# Patient Record
Sex: Female | Born: 1970
Health system: Southern US, Community
[De-identification: ages and names within clinical notes are randomized; demographics above are authoritative.]

## PROBLEM LIST (undated history)

## (undated) DIAGNOSIS — I10 Essential (primary) hypertension: Secondary | ICD-10-CM

## (undated) DIAGNOSIS — F411 Generalized anxiety disorder: Secondary | ICD-10-CM

## (undated) HISTORY — DX: Essential (primary) hypertension: I10

## (undated) HISTORY — DX: Generalized anxiety disorder: F41.1

## (undated) HISTORY — PX: TONSILLECTOMY: SUR1361

---

## 2000-11-30 ENCOUNTER — Encounter: Payer: Self-pay | Admitting: Otolaryngology

## 2000-11-30 ENCOUNTER — Ambulatory Visit (HOSPITAL_COMMUNITY): Admission: RE | Admit: 2000-11-30 | Discharge: 2000-11-30 | Payer: Self-pay | Admitting: Family Medicine

## 2001-08-09 ENCOUNTER — Other Ambulatory Visit: Admission: RE | Admit: 2001-08-09 | Discharge: 2001-08-09 | Payer: Self-pay | Admitting: Obstetrics and Gynecology

## 2001-12-12 ENCOUNTER — Ambulatory Visit (HOSPITAL_COMMUNITY): Admission: RE | Admit: 2001-12-12 | Discharge: 2001-12-12 | Payer: Self-pay | Admitting: Obstetrics and Gynecology

## 2002-02-12 ENCOUNTER — Inpatient Hospital Stay (HOSPITAL_COMMUNITY): Admission: AD | Admit: 2002-02-12 | Discharge: 2002-02-12 | Payer: Self-pay | Admitting: Obstetrics and Gynecology

## 2002-02-19 ENCOUNTER — Inpatient Hospital Stay (HOSPITAL_COMMUNITY): Admission: AD | Admit: 2002-02-19 | Discharge: 2002-02-23 | Payer: Self-pay | Admitting: Obstetrics and Gynecology

## 2002-02-20 ENCOUNTER — Encounter (INDEPENDENT_AMBULATORY_CARE_PROVIDER_SITE_OTHER): Payer: Self-pay

## 2002-02-24 ENCOUNTER — Encounter: Admission: RE | Admit: 2002-02-24 | Discharge: 2002-03-26 | Payer: Self-pay | Admitting: Obstetrics and Gynecology

## 2002-03-27 ENCOUNTER — Encounter: Admission: RE | Admit: 2002-03-27 | Discharge: 2002-04-26 | Payer: Self-pay | Admitting: Obstetrics and Gynecology

## 2002-04-18 ENCOUNTER — Other Ambulatory Visit: Admission: RE | Admit: 2002-04-18 | Discharge: 2002-04-18 | Payer: Self-pay | Admitting: Obstetrics and Gynecology

## 2003-05-29 ENCOUNTER — Other Ambulatory Visit: Admission: RE | Admit: 2003-05-29 | Discharge: 2003-05-29 | Payer: Self-pay | Admitting: Obstetrics and Gynecology

## 2003-12-02 ENCOUNTER — Ambulatory Visit (HOSPITAL_COMMUNITY): Admission: RE | Admit: 2003-12-02 | Discharge: 2003-12-02 | Payer: Self-pay | Admitting: Family Medicine

## 2003-12-09 ENCOUNTER — Ambulatory Visit (HOSPITAL_COMMUNITY): Admission: RE | Admit: 2003-12-09 | Discharge: 2003-12-09 | Payer: Self-pay | Admitting: Family Medicine

## 2005-03-01 ENCOUNTER — Other Ambulatory Visit: Admission: RE | Admit: 2005-03-01 | Discharge: 2005-03-01 | Payer: Self-pay | Admitting: Obstetrics and Gynecology

## 2005-06-30 ENCOUNTER — Ambulatory Visit (HOSPITAL_COMMUNITY): Admission: RE | Admit: 2005-06-30 | Discharge: 2005-06-30 | Payer: Self-pay | Admitting: Family Medicine

## 2006-05-01 ENCOUNTER — Emergency Department (HOSPITAL_COMMUNITY): Admission: EM | Admit: 2006-05-01 | Discharge: 2006-05-01 | Payer: Self-pay | Admitting: Emergency Medicine

## 2007-06-13 ENCOUNTER — Encounter: Admission: RE | Admit: 2007-06-13 | Discharge: 2007-06-13 | Payer: Self-pay | Admitting: Obstetrics and Gynecology

## 2010-05-24 ENCOUNTER — Encounter: Payer: Self-pay | Admitting: Obstetrics and Gynecology

## 2010-06-12 ENCOUNTER — Other Ambulatory Visit: Payer: Self-pay | Admitting: Obstetrics and Gynecology

## 2010-06-12 DIAGNOSIS — N644 Mastodynia: Secondary | ICD-10-CM

## 2010-06-18 ENCOUNTER — Ambulatory Visit
Admission: RE | Admit: 2010-06-18 | Discharge: 2010-06-18 | Disposition: A | Discharge status: Home / Self Care | Payer: Commercial Indemnity | Source: Ambulatory Visit | Attending: Obstetrics and Gynecology | Admitting: Obstetrics and Gynecology

## 2010-06-18 DIAGNOSIS — N644 Mastodynia: Secondary | ICD-10-CM

## 2010-09-18 NOTE — H&P (Signed)
NAME:  Robin Ball, Robin Ball                        ACCOUNT NO.:  [id number]   MEDICAL RECORD NO.:  [id number]                   PATIENT TYPE:  INP   LOCATION:  9164                                 FACILITY:  [facility]   PHYSICIAN:  Juluis Mire, M.D.                DATE OF BIRTH:  1970-12-19   DATE OF ADMISSION:  02/19/2002  DATE OF DISCHARGE:                                HISTORY & PHYSICAL   HISTORY OF PRESENT ILLNESS:  The patient is a 40 year old primigravida  married female who presents for Cytotec ripening of the cervix and  induction.  Her last menstrual period was January 28 giving her an estimated  date of confinement of November 4 and an estimated gestational age of [redacted]  weeks.  This is consistent with initial examination and prior ultrasound.   In relation to the present admission, the patient has had some mildly  elevated mean arterial pressures.  She has been running systolics in the  130s and diastolics between 80 and 90.  Because of this she is brought in  for Cytotec ripening of the cervix and induction.  She has had some problems  with slight headaches.  She denies any right upper quadrant pain or  scotomata.  Her prenatal course has otherwise been uncomplicated.  She has  had a slightly elevated 50 g Glucola but her three hour glucose tolerance  test was within normal limits.  Her group B Strep was also positive.   ALLERGIES:  No known drug allergies.   MEDICATIONS:  Prenatal vitamins.   PAST MEDICAL HISTORY:  Please see prenatal records.   SOCIAL HISTORY:  Please see prenatal records.   FAMILY HISTORY:  Please see prenatal records.   REVIEW OF SYSTEMS:  Noncontributory.   PHYSICAL EXAMINATION:  VITAL SIGNS:  The patient's blood pressure is 126/84.  The vital signs are stable.  HEENT:  The patient is normocephalic.  Pupils are equal, round, and reactive  to light and accommodation.  Extraocular movements are intact.  Sclerae and  conjunctivae are clear.   Oropharynx clear.  NECK:  Without thyromegaly.  BREASTS:  Not examined.  LUNGS:  Clear.  CARDIOVASCULAR:  Regular rate with a grade 2/6 systolic ejection murmur.  No  clicks or gallops.  ABDOMEN:  Gravid uterus consistent with dates.  PELVIC:  1-2 cm, 50% effaced, vertex presentation, -1 station.  The  membranes intact.  EXTREMITIES:  Trace edema.  Deep tendon reflexes 2+ with no clonus.   IMPRESSION:  1. Intrauterine pregnancy at 38 weeks with mild elevation of the mean     arterial pressure.  2. Positive group B Strep.    PLAN:  The patient will undergo Cytotec ripening of the cervix with  induction.  Group B Strep antibiotics will be started and routine  obstetrical management will be undertaken.  Juluis Mire, M.D.    JSM/MEDQ  D:  02/20/2002  T:  02/20/2002  Job:  [id number]

## 2010-09-18 NOTE — Op Note (Signed)
NAME:  Robin Ball, Robin Ball                        ACCOUNT NO.:  [id number]   MEDICAL RECORD NO.:  [id number]                   PATIENT TYPE:  INP   LOCATION:  9145                                 FACILITY:  [facility]   PHYSICIAN:  Guy Sandifer. Arleta Creek, M.D.           DATE OF BIRTH:  06/17/1970   DATE OF PROCEDURE:  02/20/2002  DATE OF DISCHARGE:                                 OPERATIVE REPORT   PREOPERATIVE DIAGNOSIS:  Arrest of descent.   POSTOPERATIVE DIAGNOSIS:  Arrest of descent.   PROCEDURE:  Low transverse cesarean section.   SURGEON:  Guy Sandifer. Henderson Cloud, M.D.   ANESTHESIA:  Epidural, Burnett Corrente, M.D.   ESTIMATED BLOOD LOSS:  1000 cc.   SPECIMENS:  Placenta, sent to pathology.   FINDINGS:  Viable female infant, Apgars of 8 and 9 at one and five minutes,  respectively.  Birth weight 8 pounds 6 ounces.  Arterial cord pH 7.31.   INDICATIONS AND CONSENT:  The patient is a 40 year old married white female,  G1, P0, who was admitted on the evening of 02/19/02 for a two-stage  induction secondary to mild pregnancy-induced hypertension.  On the morning  of 02/20/02, she was 4 cm dilated, 50% effaced, and artificial rupture of  membranes with clear fluid was carried out.  Pitocin augmentation was  subsequently started.  She progressed to complete and pushing but had a  posterior presentation.  She did not make it below 0 station after one hour  of pushing.  Diagnosis of arrest of descent was made.  Cesarean section was  discussed with the patient and husband.  Potential risks and complications  were discussed preoperatively, including but not limited to infection, organ  damage, bleeding requiring transfusion of blood products with possible  transfusion, HIV, and hepatitis acquisition.  All questions were answered.  A consent is signed on the chart.   DESCRIPTION OF PROCEDURE:  The patient is taken to the operating room, where  epidural anesthetic is augmented to a surgical level.   Foley catheter is  already in place.  She is prepped and draped in a sterile fashion.  After  testing for adequate anesthesia, skin is entered through a Pfannenstiel  incision and dissection is carried out in layers to the peritoneum.  The  peritoneum is incised, extended superiorly and inferiorly.  The  vesicouterine peritoneum is taken down cephalolaterally.  The bladder flap  is developed, and the bladder blade is placed.  The uterus is incised in a  low transverse fashion, and the uterine cavity is entered bluntly with a  hemostat.  The uterine incision is extended cephalolaterally with the  fingers, and the vertex is delivered without difficulty.  The oropharynx and  nasopharynx are suctioned.  The remainder of the baby is delivered.  Good  cry and tone is noted.  Cord is clamped and cut.  The placenta is delivered  and sent to pathology.  The uterine cavity is cleaned.  The uterus is closed  in a running locking layer of 0 Monocryl, followed by a running imbricating  layer of 0 Monocryl, which achieves good hemostasis.  Tubes and ovaries are  normal bilaterally.  The left angle of the incision extended some at the  anterior leaf of the broad ligament.  This was closed with the imbricating  layer.  Careful palpation revealed this did not extend through the posterior  leaf of the broad or did not involve any bowel or other structures  posteriorly.  Irrigation is carried out.  Anterior peritoneum is closed in  running fashion with 0 Monocryl, which is also used to reapproximate the  pyramidalis muscle in the midline.  Anterior rectus fascia is closed in a  running fashion with 0 PDS suture.  The patient has some discomfort at this  point, and the incision is irrigated with approximately 10-15 cc of 0.5%  plain Marcaine.  The skin is closed with clips.  All sponge, instrument, and  needle counts are correct, and the patient is transferred to the recovery  room in stable  condition.                                               Guy Sandifer Arleta Creek, M.D.    JET/MEDQ  D:  02/20/2002  T:  02/21/2002  Job:  [id number]

## 2010-09-18 NOTE — Discharge Summary (Signed)
NAME:  Robin Ball, Robin Ball                        ACCOUNT NO.:  [id number]   MEDICAL RECORD NO.:  [id number]                   PATIENT TYPE:  INP   LOCATION:  9145                                 FACILITY:  [facility]   PHYSICIAN:  Duke Salvia. Marcelle Overlie, M.D.            DATE OF BIRTH:  09-24-1970   DATE OF ADMISSION:  02/19/2002  DATE OF DISCHARGE:  02/23/2002                                 DISCHARGE SUMMARY   ADMISSION DIAGNOSES:  1. Intrauterine pregnancy at 38 weeks, estimated gestational age.  2. Mild pregnancy induced hypertension.  3. Induction of labor.  4. Arrest of descent.   DISCHARGE DIAGNOSES:  1. Status post low transverse cesarean section.  2. Viable female infant.   PROCEDURE:  Primary low transverse cesarean section.   REASON FOR ADMISSION:  Please seen dictated H&P.   HOSPITAL COURSE:  The patient was a 40 year old gravida 1, para 0 married  white female at [redacted] weeks gestational age who was admitted to [facility] for an induction of labor.  The patient had had mild  elevation in main arterial pressures.  She had noted a slight headache.  She  denied right upper quadrant pain or scotomata.  Deep tendon reflexes are 2+  without clonus.  The patient was admitted for Cytotec for ripening of the  cervix.  Cervix was noted to be 1 to 2 cm dilated at 50% effaced.  Vertex at  a -1 station.  Membranes were intact.  The patient was also noted to be  positive for group B Beta Strep.  IV antibiotics were administered.  On the  following morning, cervix was noted to be 4 cm dilated, 50% effaced.  Artificial rupture of membranes was performed which revealed clear fluid.  Pitocin augmentation was started.  The patient did progress to complete  dilation.  Vertex was noted in the OP position.  After pushing x1 hour  without progress below the 0 station, decision was made to proceed with a  cesarean delivery.  The patient was taken to the operating room where  epidural  anesthesia was dosed adequately to a surgical level.  A low  transverse incision was made with the delivery of a viable female infant who  weight 8 pounds 6 ounces with Apgars of 8 at one minute and 9 at five  minutes.  Umbilical artery pH was 7.31.  The patient tolerated the procedure  well and was taken to the recovery room in stable condition.   On postoperative day #1, the patient had good return of bowel function.  Abdominal dressing was noted to be clean, dry and intact.  Labs revealed  hemoglobin of 8.7, platelet count 268,000 and wbc count of 13.8.   On postoperative day #2, abdominal dressing was partially removed which  revealed incision that was clean, dry and intact.  The patient was  ambulating without difficulty.  She was tolerating a regular diet without  complaints of nausea and vomiting.   On postoperative day #3, incision was clean, dry, and intact.  Staples were  removed and the patient was discharged home.   CONDITION ON DISCHARGE:  Good.   DIET:  Regular.   ACTIVITY:  As tolerated.  No heavy lifting.  No driving x2 weeks.  No  vaginal entry.   FOLLOW UP:  The patient is to follow up in the office in one to two weeks.   DISCHARGE INSTRUCTIONS:  She is to call for temperature greater than 100  degrees, persistent nausea and vomiting, heavy vaginal bleeding, and/or  redness or drainage from the incision site.   DISCHARGE MEDICATIONS:  1. Percocet 5/325 #30 one p.o. every four to six hours p.r.n. pain.  2. Motrin 600 mg every six hours p.r.n.  3. Prenatal vitamins one p.o. daily.  4. Niferex 150 mg one p.o. daily.  5. ProctoFoam HC, the patient to apply to affected area b.i.d.  6. Colace one p.o. daily p.r.n.     Julio Sicks, N.P.                        Richard M. Marcelle Overlie, M.D.    CC/MEDQ  D:  03/26/2002  T:  03/26/2002  Job:  [id number]

## 2011-08-16 ENCOUNTER — Other Ambulatory Visit: Payer: Self-pay | Admitting: Family Medicine

## 2011-08-16 ENCOUNTER — Ambulatory Visit (HOSPITAL_COMMUNITY)
Admission: RE | Admit: 2011-08-16 | Discharge: 2011-08-16 | Disposition: A | Discharge status: Home / Self Care | Payer: Commercial Indemnity | Source: Ambulatory Visit | Attending: Family Medicine | Admitting: Family Medicine

## 2011-08-16 DIAGNOSIS — M25569 Pain in unspecified knee: Secondary | ICD-10-CM | POA: Diagnosis not present

## 2011-08-16 DIAGNOSIS — M7730 Calcaneal spur, unspecified foot: Secondary | ICD-10-CM | POA: Insufficient documentation

## 2011-08-16 DIAGNOSIS — M25562 Pain in left knee: Secondary | ICD-10-CM

## 2011-08-16 DIAGNOSIS — M25579 Pain in unspecified ankle and joints of unspecified foot: Secondary | ICD-10-CM | POA: Insufficient documentation

## 2012-05-15 ENCOUNTER — Ambulatory Visit (HOSPITAL_COMMUNITY)
Admission: RE | Admit: 2012-05-15 | Discharge: 2012-05-15 | Disposition: A | Discharge status: Home / Self Care | Payer: Commercial Indemnity | Source: Ambulatory Visit | Attending: Family Medicine | Admitting: Family Medicine

## 2012-05-15 ENCOUNTER — Other Ambulatory Visit: Payer: Self-pay | Admitting: Family Medicine

## 2012-05-15 DIAGNOSIS — R229 Localized swelling, mass and lump, unspecified: Secondary | ICD-10-CM | POA: Insufficient documentation

## 2012-05-30 ENCOUNTER — Encounter (INDEPENDENT_AMBULATORY_CARE_PROVIDER_SITE_OTHER): Payer: Self-pay | Admitting: General Surgery

## 2012-05-30 ENCOUNTER — Ambulatory Visit (INDEPENDENT_AMBULATORY_CARE_PROVIDER_SITE_OTHER): Payer: Commercial Indemnity | Admitting: General Surgery

## 2012-05-30 VITALS — BP 142/84 | HR 80 | Temp 98.2°F | Resp 16 | Ht 63.0 in | Wt 161.4 lb

## 2012-05-30 DIAGNOSIS — R229 Localized swelling, mass and lump, unspecified: Secondary | ICD-10-CM

## 2012-05-30 DIAGNOSIS — R222 Localized swelling, mass and lump, trunk: Secondary | ICD-10-CM

## 2012-05-30 DIAGNOSIS — K649 Unspecified hemorrhoids: Secondary | ICD-10-CM

## 2012-05-30 MED ORDER — DOCUSATE SODIUM 100 MG PO CAPS: 100.0000 mg | ORAL_CAPSULE | Freq: Two times a day (BID) | ORAL | Status: DC

## 2012-05-30 MED ORDER — HYDROCORTISONE 2.5 % RE CREA: TOPICAL_CREAM | Freq: Two times a day (BID) | RECTAL | Status: DC

## 2012-05-30 NOTE — Patient Instructions (Addendum)
Hemorrhoids  Hemorrhoids are enlarged (dilated) veins around the rectum. There are 2 types of hemorrhoids, and the type of hemorrhoid is determined by its location. Internal hemorrhoids occur in the veins just inside the rectum.They are usually not painful, but they may bleed.However, they may poke through to the outside and become irritated and painful. External hemorrhoids involve the veins outside the anus and can be felt as a painful swelling or hard lump near the anus.They are often itchy and may crack and bleed. Sometimes clots will form in the veins. This makes them swollen and painful. These are called thrombosed hemorrhoids. CAUSES Causes of hemorrhoids include:  Pregnancy. This increases the pressure in the hemorrhoidal veins.   Constipation.   Straining to have a bowel movement.   Obesity.   Heavy lifting or other activity that caused you to strain.   TREATMENT Most of the time hemorrhoids improve in 1 to 2 weeks. However, if symptoms do not seem to be getting better or if you have a lot of rectal bleeding, your caregiver may perform a procedure to help make the hemorrhoids get smaller or remove them completely.Possible treatments include:  Rubber band ligation. A rubber band is placed at the base of the hemorrhoid to cut off the circulation.   Sclerotherapy. A chemical is injected to shrink the hemorrhoid.   Infrared light therapy. Tools are used to burn the hemorrhoid.   Hemorrhoidectomy. This is surgical removal of the hemorrhoid.   HOME CARE INSTRUCTIONS   Increase fiber in your diet. Ask your caregiver about using fiber supplements.   Drink enough water and fluids to keep your urine clear or pale yellow.   Exercise regularly.   Go to the bathroom when you have the urge to have a bowel movement. Do not wait.   Avoid straining to have bowel movements.   Keep the anal area dry and clean.   Only take over-the-counter or prescription medicines for pain,  discomfort, or fever as directed by your caregiver.   Take warm sitz baths for 20 to 30 minutes, 3 to 4 times per day.   If the hemorrhoids are very tender and swollen, place ice packs on the area as tolerated. Using ice packs between sitz baths may be helpful. Fill a plastic bag with ice. Place a towel between the bag of ice and your skin.   Medicated creams and suppositories may be used or applied as directed.   Do not use a donut-shaped pillow or sit on the toilet for long periods. This increases blood pooling and pain.   SEEK MEDICAL CARE IF:   You have increasing pain and swelling that is not controlled with your medicine.   You have uncontrolled bleeding.   You have difficulty or you are unable to have a bowel movement.   You have pain or inflammation outside the area of the hemorrhoids.   You have chills or an oral temperature above 102 F (38.9 C).     

## 2012-05-30 NOTE — Progress Notes (Signed)
Patient ID: Robin Ball, female   DOB: [birth year], 42 y.o.   MRN: [id number]  Chief Complaint  Patient presents with  . New Evaluation    eval cyst on lower back    HPI Robin Ball is a 42 y.o. female.   HPI Patient is a 42 year old female who presents with a nodule on her left back she noticed approximately a month ago. She stated that when she first noticed it was much larger than it is now. It was red for a short amount of time and she describes it as almost like a pimple.  It has gotten less tender and smaller. She says it is difficult to feel when she is lying down. It does get sore after she's been on her feet for a long period of time. She feels a tugging that extends "to the bone."  She also has had issues with hemorrhoids since the birth of her child. She states that is "done with it". She thinks these are mostly skin tacks. She denies bleeding or itching. Her primary difficulty is in cleaning and having to wipe often. She has not taken any over-the-counter or prescription hemorrhoid medications. She has not taken any Colace previously.   History reviewed. No pertinent past medical history.  Past Surgical History  Procedure Date  . Cesarean section     Family History  Problem Relation Age of Onset  . Heart disease Father   . Hypertension Father     Social History History  Substance Use Topics  . Smoking status: Former Games developer  . Smokeless tobacco: Never Used  . Alcohol Use: No    No Known Allergies  Current Outpatient Prescriptions  Medication Sig Dispense Refill  . CRYSELLE-28 0.3-30 MG-MCG tablet       . docusate sodium (COLACE) 100 MG capsule Take 1 capsule (100 mg total) by mouth 2 (two) times daily.  60 capsule  1  . hydrocortisone (ANUSOL-HC) 2.5 % rectal cream Place rectally 2 (two) times daily.  30 g  2    Review of Systems Review of Systems  All other systems reviewed and are negative.    Blood pressure 142/84, pulse 80, temperature 98.2 F  (36.8 C), temperature source Oral, resp. rate 16, height 5\' 3"  (1.6 m), weight 161 lb 6.4 oz (73.211 kg).  Physical Exam Physical Exam  Constitutional: She is oriented to person, place, and time. She appears well-developed and well-nourished. No distress.  HENT:  Head: Normocephalic and atraumatic.  Right Ear: External ear normal.  Left Ear: External ear normal.  Eyes: Conjunctivae normal are normal. Pupils are equal, round, and reactive to light. Right eye exhibits no discharge. Left eye exhibits no discharge. No scleral icterus.  Neck: Normal range of motion. No thyromegaly present.  Cardiovascular: Normal rate, regular rhythm, normal heart sounds and intact distal pulses.   Pulmonary/Chest: Effort normal and breath sounds normal. No respiratory distress. She exhibits no tenderness.  Abdominal: Soft. She exhibits no distension and no mass. There is no tenderness. There is no guarding.  Genitourinary: Rectal exam shows external hemorrhoid (anterior external hemorrhoids.  Posteriorly are flatter skin tags.  ).  Musculoskeletal: Normal range of motion. She exhibits tenderness (over left iliac crest.  small 1 cm thickening.  very difficult to feel while lying prone.  easier to feel while standing.). She exhibits no edema.  Neurological: She is alert and oriented to person, place, and time. Coordination normal.  Skin: Skin is warm and dry. No rash noted.  She is not diaphoretic. No erythema. No pallor.  Psychiatric: She has a normal mood and affect. Her behavior is normal. Judgment and thought content normal.    Data Reviewed Ultrasound shows 8x6x8 mm nodule partially in the dermis of the affected location.    Assessment/Plan    Mass on back Likely small sebaceous cyst, but difficult to feel.    Has already improved significantly.   Will recheck in 8 weeks.   If still bothering her, will schedule removal.    Hemorrhoids Posterior components are skin tags, but anterior are  hemorrhoids.  Will do stool softeners and anusol HC  Follow up in 8 weeks.            Amity Roes 05/30/2012, 12:31 PM

## 2012-05-30 NOTE — Assessment & Plan Note (Signed)
Likely small sebaceous cyst, but difficult to feel.    Has already improved significantly.   Will recheck in 8 weeks.   If still bothering her, will schedule removal.

## 2012-05-30 NOTE — Assessment & Plan Note (Signed)
Posterior components are skin tags, but anterior are hemorrhoids.  Will do stool softeners and anusol HC  Follow up in 8 weeks.

## 2012-12-30 ENCOUNTER — Emergency Department (HOSPITAL_COMMUNITY)
Admission: EM | Admit: 2012-12-30 | Discharge: 2012-12-30 | Disposition: A | Discharge status: Home / Self Care | Payer: Commercial Indemnity | Attending: Emergency Medicine | Admitting: Emergency Medicine

## 2012-12-30 ENCOUNTER — Encounter (HOSPITAL_COMMUNITY): Payer: Self-pay

## 2012-12-30 DIAGNOSIS — Z87891 Personal history of nicotine dependence: Secondary | ICD-10-CM | POA: Insufficient documentation

## 2012-12-30 DIAGNOSIS — R110 Nausea: Secondary | ICD-10-CM | POA: Insufficient documentation

## 2012-12-30 DIAGNOSIS — K5900 Constipation, unspecified: Secondary | ICD-10-CM | POA: Insufficient documentation

## 2012-12-30 DIAGNOSIS — K645 Perianal venous thrombosis: Secondary | ICD-10-CM | POA: Insufficient documentation

## 2012-12-30 MED ORDER — HYDROCODONE-ACETAMINOPHEN 5-325 MG PO TABS
1.0000 | ORAL_TABLET | Freq: Once | ORAL | Status: AC
  Administered 2012-12-30: 1 via ORAL
  Filled 2012-12-30: qty 1

## 2012-12-30 MED ORDER — BELLADONNA ALKALOIDS-OPIUM 16.2-60 MG RE SUPP: 60.0000 mg | Freq: Four times a day (QID) | RECTAL | Status: DC | PRN

## 2012-12-30 MED ORDER — DOCUSATE SODIUM 100 MG PO CAPS: 100.0000 mg | ORAL_CAPSULE | Freq: Two times a day (BID) | ORAL | Status: DC | PRN

## 2012-12-30 NOTE — ED Provider Notes (Signed)
CSN: [id number]     Arrival date & time 12/30/12  1308 History   First MD Initiated Contact with Patient 12/30/12 1314     No chief complaint on file.  (Consider location/radiation/quality/duration/timing/severity/associated sxs/prior Treatment) HPI Comments: Patient with long-standing history of hemorrhoids presents with extreme pain from her external hemorrhoids x24 hours. States the entire region as throbbing and she has occasional sharp pains. States that this pain is worse than she has ever had before with her hemorrhoids. Her records, patient has been seen by [facility] surgery, Dr. Donell Beers, in the past for the same.  Denies fevers, chills, abdominal pain. Patient has had harder stools recently. Denies blood in her stool has been using topical hydrocortisone cream and Aleve without improvement.  The history is provided by the patient.    History reviewed. No pertinent past medical history. Past Surgical History  Procedure Laterality Date  . Cesarean section     Family History  Problem Relation Age of Onset  . Heart disease Father   . Hypertension Father    History  Substance Use Topics  . Smoking status: Former Games developer  . Smokeless tobacco: Never Used  . Alcohol Use: No   OB History   Grav Para Term Preterm Abortions TAB SAB Ect Mult Living                 Review of Systems  Constitutional: Negative for fever and chills.  Gastrointestinal: Positive for nausea, constipation and rectal pain. Negative for vomiting, abdominal pain, blood in stool and anal bleeding.    Allergies  Review of patient's allergies indicates no known allergies.  Home Medications   Current Outpatient Rx  Name  Route  Sig  Dispense  Refill  . CRYSELLE-28 0.3-30 MG-MCG tablet               . docusate sodium (COLACE) 100 MG capsule   Oral   Take 1 capsule (100 mg total) by mouth 2 (two) times daily.   60 capsule   1   . hydrocortisone (ANUSOL-HC) 2.5 % rectal cream   Rectal  Place rectally 2 (two) times daily.   30 g   2    BP 149/96  Pulse 113  Temp(Src) 98 F (36.7 C) (Oral)  Resp 16  SpO2 97%  LMP 12/22/2012 Physical Exam  Nursing note and vitals reviewed. Constitutional: She appears well-developed and well-nourished. No distress.  HENT:  Head: Normocephalic and atraumatic.  Neck: Neck supple.  Pulmonary/Chest: Effort normal.  Genitourinary:  Anal exam performed with tech Herbert Seta present as chaperone.  No rectal exam performed.  Pt with large ecchymotic firm external hemorrhoid.    Neurological: She is alert.  Skin: She is not diaphoretic.    ED Course  INCISION AND DRAINAGE Date/Time: 12/30/2012 3:48 PM Performed by: Trixie Dredge Authorized by: Trixie Dredge Consent: Verbal consent obtained. Risks and benefits: risks, benefits and alternatives were discussed Consent given by: patient Patient understanding: patient states understanding of the procedure being performed Patient identity confirmed: verbally with patient Indications for incision and drainage: external hemorrhoid, thrombosed. Body area: anogenital Location details: perianal Anesthesia: local infiltration Local anesthetic: lidocaine 2% without epinephrine Anesthetic total: 5 ml Patient sedated: no Scalpel size: 11 Incision type: single straight Complexity: simple Drainage: bloody (dark blood with clot) Drainage amount: moderate Wound treatment: wound left open Packing material: none Patient tolerance: Patient tolerated the procedure well with no immediate complications.   (including critical care time) Labs Review Labs Reviewed - No  data to display Imaging Review No results found.  Discussed procedure and pain control afterwards with Dr. Jeraldine Loots  MDM   1. Thrombosed external hemorrhoid    Patient with thrombosed external hemorrhoid.  Discussed the options of conservative management versus intervention here with hemorrhoidectomy in ED.  Pt chose to have procedure  done here today. Procedure performed under local anesthesia with retrieval of clot and dark blood. Patient is advised of the risk of infection.  Per my discussion with Dr Jeraldine Loots and review of Up to Date, pt not sent home on antibiotic.  Discussed home care with the patient including pain management, sitz baths, stool softeners. Patient also advised to followup with [facility] surgery. Discussed all results with patient.  Pt given return precautions.  Pt verbalizes understanding and agrees with plan.       [place], PA-C 12/30/12 1552

## 2012-12-30 NOTE — ED Notes (Signed)
Pt. Has hemorrhoids that are clotted and swollen.

## 2012-12-30 NOTE — ED Provider Notes (Signed)
  Medical screening examination/treatment/procedure(s) were performed by non-physician practitioner and as supervising physician I was immediately available for consultation/collaboration.    Gerhard Munch, MD 12/30/12 [phone]

## 2013-02-19 ENCOUNTER — Other Ambulatory Visit: Payer: Self-pay | Admitting: Nurse Practitioner

## 2013-03-02 ENCOUNTER — Emergency Department (HOSPITAL_COMMUNITY)
Admission: EM | Admit: 2013-03-02 | Discharge: 2013-03-02 | Disposition: A | Discharge status: Home / Self Care | Payer: Commercial Indemnity | Attending: Emergency Medicine | Admitting: Emergency Medicine

## 2013-03-02 ENCOUNTER — Encounter (HOSPITAL_COMMUNITY): Payer: Self-pay | Admitting: Emergency Medicine

## 2013-03-02 ENCOUNTER — Emergency Department (HOSPITAL_COMMUNITY): Payer: Commercial Indemnity

## 2013-03-02 DIAGNOSIS — R197 Diarrhea, unspecified: Secondary | ICD-10-CM | POA: Insufficient documentation

## 2013-03-02 DIAGNOSIS — R1084 Generalized abdominal pain: Secondary | ICD-10-CM | POA: Insufficient documentation

## 2013-03-02 DIAGNOSIS — Z3202 Encounter for pregnancy test, result negative: Secondary | ICD-10-CM | POA: Insufficient documentation

## 2013-03-02 DIAGNOSIS — Z87891 Personal history of nicotine dependence: Secondary | ICD-10-CM | POA: Insufficient documentation

## 2013-03-02 DIAGNOSIS — Z79899 Other long term (current) drug therapy: Secondary | ICD-10-CM | POA: Insufficient documentation

## 2013-03-02 DIAGNOSIS — R1110 Vomiting, unspecified: Secondary | ICD-10-CM | POA: Insufficient documentation

## 2013-03-02 DIAGNOSIS — R109 Unspecified abdominal pain: Secondary | ICD-10-CM

## 2013-03-02 LAB — CBC WITH DIFFERENTIAL/PLATELET
Basophils Absolute: 0 10*3/uL (ref 0.0–0.1)
Basophils Relative: 0 % (ref 0–1)
Eosinophils Absolute: 0.1 10*3/uL (ref 0.0–0.7)
Eosinophils Relative: 1 % (ref 0–5)
HCT: 46.7 % — ABNORMAL HIGH (ref 36.0–46.0)
MCH: 31.6 pg (ref 26.0–34.0)
MCHC: 34.5 g/dL (ref 30.0–36.0)
MCV: 91.7 fL (ref 78.0–100.0)
Monocytes Absolute: 0.5 10*3/uL (ref 0.1–1.0)
RDW: 13.1 % (ref 11.5–15.5)

## 2013-03-02 LAB — COMPREHENSIVE METABOLIC PANEL
AST: 13 U/L (ref 0–37)
Albumin: 3.9 g/dL (ref 3.5–5.2)
BUN: 9 mg/dL (ref 6–23)
Calcium: 9.2 mg/dL (ref 8.4–10.5)
Creatinine, Ser: 0.65 mg/dL (ref 0.50–1.10)

## 2013-03-02 LAB — LIPASE, BLOOD: Lipase: 24 U/L (ref 11–59)

## 2013-03-02 LAB — URINE MICROSCOPIC-ADD ON

## 2013-03-02 LAB — URINALYSIS, ROUTINE W REFLEX MICROSCOPIC
Glucose, UA: NEGATIVE mg/dL
Nitrite: NEGATIVE
Protein, ur: NEGATIVE mg/dL
pH: 7 (ref 5.0–8.0)

## 2013-03-02 LAB — POCT PREGNANCY, URINE: Preg Test, Ur: NEGATIVE

## 2013-03-02 MED ORDER — ONDANSETRON 4 MG PO TBDP: 4.0000 mg | ORAL_TABLET | Freq: Three times a day (TID) | ORAL | Status: DC | PRN

## 2013-03-02 MED ORDER — DICYCLOMINE HCL 10 MG/ML IM SOLN
20.0000 mg | Freq: Once | INTRAMUSCULAR | Status: AC
  Administered 2013-03-02: 20 mg via INTRAMUSCULAR
  Filled 2013-03-02: qty 2

## 2013-03-02 NOTE — ED Provider Notes (Signed)
Full history, review of systems, and physical examination on Irish Elders NP note.  Physical Exam  BP 158/90  Pulse 67  Temp(Src) 98.4 F (36.9 C) (Oral)  Resp 14  Ht 5\' 3"  (1.6 m)  Wt 153 lb (69.4 kg)  BMI 27.11 kg/m2  SpO2 99%  LMP 02/16/2013  Physical Exam  Constitutional: She is oriented to person, place, and time. She appears well-developed and well-nourished. No distress.  HENT:  Head: Normocephalic and atraumatic.  Right Ear: External ear normal.  Left Ear: External ear normal.  Nose: Nose normal.  Mouth/Throat: Oropharynx is clear and moist.  Eyes: Conjunctivae are normal.  Neck: Normal range of motion. Neck supple.  Pulmonary/Chest: Effort normal.  Abdominal: Soft. There is no tenderness.  Musculoskeletal: Normal range of motion.  Neurological: She is alert and oriented to person, place, and time.  Skin: Skin is warm and dry. She is not diaphoretic.  Psychiatric: She has a normal mood and affect.    ED Course  Procedures CT Abdomen Pelvis Wo Contrast (Final result)  Result time: 03/02/13 20:47:49    Final result by Rad Results In Interface (03/02/13 20:47:49)    Narrative:   CLINICAL DATA: Bilateral lower abdominal pain and cramping  EXAM: CT ABDOMEN AND PELVIS WITHOUT CONTRAST  TECHNIQUE: Multidetector CT imaging of the abdomen and pelvis was performed following the standard protocol without intravenous contrast.  COMPARISON: None.  FINDINGS: Minimal dependent bibasilar atelectasis or scarring noted.  3 mm too small to characterize medial segment left hepatic lobe lesion identified image 14, statistically most likely a cyst. Adrenal glands, gallbladder, kidneys, spleen, and pancreas are normal. No radiopaque renal, ureteral, or bladder calculus. No ascites or lymphadenopathy. No free air. Mild atheromatous aortic calcification without aneurysm.  The appendix is normal. Presumed physiologic 2.7 cm left ovarian follicle. Uterus and right ovary are  normal. No bowel wall thickening or focal segmental dilatation. No acute osseous abnormality. Right symphysis pubis bone island and right sacral bone island incidentally noted.  IMPRESSION: No acute intra-abdominal or pelvic pathology.   Electronically Signed By: Christiana Pellant M.D. On: 03/02/2013 20:47     MDM HEENT disposition pending CT abdomen and pelvis or renal colic rule out. CT abdomen is benign. Patient fell landed benign abdomen on reexamination. We'll send patient home with symptomatic care instructions and return precautions. Patient is agreeable to plan. Patient is stable at time of discharge. Patient d/w with Dr. Fayrene Fearing and Irish Elders, NP, agrees with plan.          Jeannetta Ellis, PA-C 03/02/13 2145

## 2013-03-02 NOTE — ED Notes (Signed)
Pt c/o abd pain with N/V/D starting today after eating lunch

## 2013-03-02 NOTE — ED Provider Notes (Signed)
CSN: [id number]     Arrival date & time 03/02/13  1527 History   First MD Initiated Contact with Patient 03/02/13 1753     Chief Complaint  Patient presents with  . Abdominal Pain   (Consider location/radiation/quality/duration/timing/severity/associated sxs/prior Treatment) Patient is a 42 y.o. female presenting with cramps. The history is provided by the patient. No language interpreter was used.  Abdominal Cramping This is a new problem. The current episode started today. The problem has been waxing and waning. Pertinent negatives include no abdominal pain, chills, fever, nausea, neck pain, numbness, rash, urinary symptoms or weakness. She has tried nothing for the symptoms.  Pt is a 42 year old female who presents today with a history of one episode of vomiting and one episode of diarrhea. Since then she has been describing a cramping, peristaltic type pain that she has been having about every 10 -15 minutes since 1100 today. She denies fever, chills or recent illness. No known sick exposure or suspicious food intake. She denies dysuria, hematuria or other urinary symptoms.   History reviewed. No pertinent past medical history. Past Surgical History  Procedure Laterality Date  . Cesarean section     Family History  Problem Relation Age of Onset  . Heart disease Father   . Hypertension Father    History  Substance Use Topics  . Smoking status: Former Games developer  . Smokeless tobacco: Never Used  . Alcohol Use: No   OB History   Grav Para Term Preterm Abortions TAB SAB Ect Mult Living                 Review of Systems  Constitutional: Negative for fever, chills and activity change.  Gastrointestinal: Negative for nausea, abdominal pain and abdominal distention.  Genitourinary: Negative for dysuria, urgency and pelvic pain.  Musculoskeletal: Negative for neck pain.  Skin: Negative for rash.  Neurological: Negative for weakness and numbness.  All other systems reviewed and are  negative.    Allergies  Review of patient's allergies indicates no known allergies.  Home Medications   Current Outpatient Rx  Name  Route  Sig  Dispense  Refill  . docusate sodium (COLACE) 100 MG capsule   Oral   Take 1 capsule (100 mg total) by mouth 2 (two) times daily.   60 capsule   1   . naproxen sodium (ALEVE) 220 MG tablet   Oral   Take 220 mg by mouth 2 (two) times daily with a meal.          BP 165/89  Pulse 70  Temp(Src) 98.4 F (36.9 C) (Oral)  Resp 17  Ht 5\' 3"  (1.6 m)  Wt 153 lb (69.4 kg)  BMI 27.11 kg/m2  SpO2 99%  LMP 02/16/2013 Physical Exam  Nursing note and vitals reviewed. Constitutional: She is oriented to person, place, and time. She appears well-developed and well-nourished. No distress.  HENT:  Head: Normocephalic and atraumatic.  Right Ear: External ear normal.  Mouth/Throat: Oropharynx is clear and moist.  Eyes: Conjunctivae and EOM are normal. Pupils are equal, round, and reactive to light.  Neck: Normal range of motion. Neck supple.  Cardiovascular: Normal rate, regular rhythm, normal heart sounds and intact distal pulses.   Pulmonary/Chest: Effort normal and breath sounds normal.  Abdominal: Soft. Normal appearance and bowel sounds are normal. There is no hepatosplenomegaly. There is generalized tenderness. There is no rigidity, no rebound, no guarding, no CVA tenderness, no tenderness at McBurney's point and negative Murphy's sign.  Diffuse tenderness in abdomen. No peritoneal signs.   Neurological: She is alert and oriented to person, place, and time.  Skin: Skin is warm and dry.  Psychiatric: She has a normal mood and affect. Her behavior is normal. Judgment and thought content normal.    ED Course  Procedures (including critical care time) Labs Review Labs Reviewed  CBC WITH DIFFERENTIAL - Abnormal; Notable for the following:    WBC 11.2 (*)    Hemoglobin 16.1 (*)    HCT 46.7 (*)    Neutrophils Relative % 82 (*)    Neutro  Abs 9.1 (*)    All other components within normal limits  COMPREHENSIVE METABOLIC PANEL - Abnormal; Notable for the following:    Glucose, Bld 106 (*)    Total Bilirubin 0.2 (*)    All other components within normal limits  URINALYSIS, ROUTINE W REFLEX MICROSCOPIC - Abnormal; Notable for the following:    APPearance CLOUDY (*)    Hgb urine dipstick MODERATE (*)    Leukocytes, UA TRACE (*)    All other components within normal limits  URINE MICROSCOPIC-ADD ON - Abnormal; Notable for the following:    Squamous Epithelial / LPF FEW (*)    All other components within normal limits  LIPASE, BLOOD  POCT PREGNANCY, URINE   Imaging Review No results found.  EKG Interpretation   None       MDM   1. Abdominal pain    Patient has had crampy like abdominal pain and generalized tenderness since 11:00 today. She reports that she has crampy-like pain that occurs every 10-15 minutes that resembles intestinal colic. She is a well appearing and abdominal exam is reassuring. She has had one episode of vomiting and one episode of diarrhea today. Urine and metabolic panel are unremarkable. Slight elevation in WBC's. Bentyl given here in ER for relief of spasms. Will obtain Abdomen/pelvis CT to r/o kidney stone even thought this is not typical presentation. Otherwise may be onset of viral illness.      Irish Elders, NP 03/02/13 2033

## 2013-03-02 NOTE — ED Notes (Signed)
Pt states that her pain has not decreased since the administration of the Bentyl. Pt states that she only has pain when she has a spasm in her abdomen. Pt is currently resting comfortably.

## 2013-03-03 NOTE — ED Provider Notes (Signed)
Medical screening examination/treatment/procedure(s) were conducted as a shared visit with non-physician practitioner(s) and myself.  I personally evaluated the patient during the encounter.  EKG Interpretation   None       Patient seen and evaluated. Reports cyclic/colicky abdominal pain since this morning. Only trace leukocytes and blood in urine. Not biliary by description. She is no tenderness on my exam now in between her episodes. She did not improve with Bentyl. Plan CT scan to exclude occult urinary stone. This is normal I think a simple treatment for intestinal colic is. indicated  Roney Marion, MD 03/03/13 1124

## 2013-03-03 NOTE — ED Provider Notes (Signed)
Medical screening examination/treatment/procedure(s) were performed by non-physician practitioner and as supervising physician I was immediately available for consultation/collaboration.  EKG Interpretation   None         Gracie Gupta S Tenasia Aull, MD 03/03/13 1128 

## 2013-04-12 ENCOUNTER — Other Ambulatory Visit: Payer: Self-pay | Admitting: Obstetrics and Gynecology

## 2013-04-12 DIAGNOSIS — R928 Other abnormal and inconclusive findings on diagnostic imaging of breast: Secondary | ICD-10-CM

## 2013-04-16 ENCOUNTER — Ambulatory Visit
Admission: RE | Admit: 2013-04-16 | Discharge: 2013-04-16 | Disposition: A | Discharge status: Home / Self Care | Payer: Commercial Indemnity | Source: Ambulatory Visit | Attending: Obstetrics and Gynecology | Admitting: Obstetrics and Gynecology

## 2013-04-16 DIAGNOSIS — R928 Other abnormal and inconclusive findings on diagnostic imaging of breast: Secondary | ICD-10-CM

## 2013-04-23 ENCOUNTER — Other Ambulatory Visit: Payer: Commercial Indemnity

## 2013-05-14 ENCOUNTER — Other Ambulatory Visit: Payer: Self-pay | Admitting: Family Medicine

## 2013-08-06 ENCOUNTER — Other Ambulatory Visit: Payer: Self-pay | Admitting: Family Medicine

## 2013-08-06 NOTE — Telephone Encounter (Signed)
3 refills , needs OV by end of script

## 2013-08-06 NOTE — Telephone Encounter (Signed)
Not seen since EPIC 

## 2013-08-13 ENCOUNTER — Encounter: Payer: Self-pay | Admitting: Nurse Practitioner

## 2013-08-13 ENCOUNTER — Ambulatory Visit (INDEPENDENT_AMBULATORY_CARE_PROVIDER_SITE_OTHER): Payer: Commercial Indemnity | Admitting: Nurse Practitioner

## 2013-08-13 VITALS — BP 152/94 | Ht 63.0 in | Wt 170.0 lb

## 2013-08-13 DIAGNOSIS — I10 Essential (primary) hypertension: Secondary | ICD-10-CM

## 2013-08-13 MED ORDER — LISINOPRIL 10 MG PO TABS: 10.0000 mg | ORAL_TABLET | Freq: Every day | ORAL | Status: DC

## 2013-08-13 NOTE — Patient Instructions (Signed)

## 2013-08-15 ENCOUNTER — Encounter: Payer: Self-pay | Admitting: Nurse Practitioner

## 2013-08-15 DIAGNOSIS — I10 Essential (primary) hypertension: Secondary | ICD-10-CM | POA: Insufficient documentation

## 2013-08-15 NOTE — Progress Notes (Signed)
Subjective:  Presents to discuss elevated blood pressure. Has had a slightly elevated blood pressure for years. Continues to have stress but this remains unchanged. Low-sodium diet. Slight weight gain. No regular exercise. Positive family history of hypertension. BP recently was 173/110 with a band like headache. Gets regular preventive health physicals with her gynecologist, BP was elevated on last visit, continues to take birth control pills. Smoker: One pack of cigarettes will last her about 3-4 days. No chest pain/ischemic type pain or shortness of breath. Headaches only with extremely elevated BP. No TIA symptoms. Has lab work done through her employer, does not have a copy of this with her today. States all of her labs were normal, the only abnormality was elevated blood pressure.  Objective:   BP 152/94  Ht 5\' 3"  (1.6 m)  Wt 170 lb (77.111 kg)  BMI 30.12 kg/m2 NAD. Alert, oriented. Lungs clear. Heart regular rate rhythm. No murmur or gallop noted. Carotids no bruits or thrills. Lower extremities no edema.  Assessment:Essential hypertension, benign  Plan: Meds ordered this encounter  Medications  . lisinopril (PRINIVIL,ZESTRIL) 10 MG tablet    Sig: Take 1 tablet (10 mg total) by mouth daily.    Dispense:  90 tablet    Refill:  1    Order Specific Question:  Supervising Provider    Answer:  Merlyn AlbertLUKING, WILLIAM S [[id number]]   lengthy discussion regarding lifestyle measures affecting her blood pressure. Strongly encouraged weight loss, healthy diet. Regular exercise and stress reduction. Strongly recommended patient quit smoking. Discussed potential problems associated with uncontrolled hypertension, light smoking and use of hormones particularly estrogen. Recommend patient discuss this with her gynecologist. Return in about 2 months (around 10/13/2013). Monitor BP in the meantime and call back if it remains above 140/90. Bring copy of most recent lab work to her next visit.

## 2013-10-29 ENCOUNTER — Other Ambulatory Visit: Payer: Self-pay | Admitting: Family Medicine

## 2013-12-27 ENCOUNTER — Encounter: Payer: Self-pay | Admitting: Nurse Practitioner

## 2013-12-27 ENCOUNTER — Ambulatory Visit (INDEPENDENT_AMBULATORY_CARE_PROVIDER_SITE_OTHER): Payer: Commercial Indemnity | Admitting: Nurse Practitioner

## 2013-12-27 VITALS — BP 118/80 | HR 80 | Ht 62.25 in | Wt 170.0 lb

## 2013-12-27 DIAGNOSIS — Z0000 Encounter for general adult medical examination without abnormal findings: Secondary | ICD-10-CM

## 2013-12-27 NOTE — Patient Instructions (Signed)
21 day fix

## 2014-01-01 ENCOUNTER — Encounter: Payer: Self-pay | Admitting: Nurse Practitioner

## 2014-01-01 NOTE — Progress Notes (Signed)
Subjective:  Presents for her wellness checkup as recommended by her employer. Gets physicals through her gynecologist. Has an active job, walking program twice a week. Healthy diet. Regular vision and dental care. Nonsmoker. No alcohol use. Denies any hearing issues. Denies any significant depression or anxiety issues.  Objective:   BP 118/80  Pulse 80  Ht 5' 2.25" (1.581 m)  Wt 170 lb (77.111 kg)  BMI 30.85 kg/m2  LMP 12/19/2013 NAD. Alert, oriented. Lungs clear. Heart regular rate rhythm.  Assessment: Visit for preventive health examination  Routine general medical examination at a health care facility - Plan: Lipid panel, Basic metabolic panel  Plan: Encouraged healthy diet and regular activity. Weight loss goal 17 pounds or 10% of her body weight. Lab work ordered per employer requirements.  Return in about 1 year (around 12/28/2014).

## 2014-01-15 ENCOUNTER — Encounter (HOSPITAL_COMMUNITY): Payer: Self-pay | Admitting: Emergency Medicine

## 2014-01-15 ENCOUNTER — Emergency Department (HOSPITAL_COMMUNITY)
Admission: EM | Admit: 2014-01-15 | Discharge: 2014-01-15 | Disposition: A | Discharge status: Home / Self Care | Payer: Commercial Indemnity | Attending: Emergency Medicine | Admitting: Emergency Medicine

## 2014-01-15 ENCOUNTER — Emergency Department (HOSPITAL_COMMUNITY): Payer: Commercial Indemnity

## 2014-01-15 DIAGNOSIS — Z3202 Encounter for pregnancy test, result negative: Secondary | ICD-10-CM | POA: Insufficient documentation

## 2014-01-15 DIAGNOSIS — Z9889 Other specified postprocedural states: Secondary | ICD-10-CM | POA: Insufficient documentation

## 2014-01-15 DIAGNOSIS — F17200 Nicotine dependence, unspecified, uncomplicated: Secondary | ICD-10-CM | POA: Insufficient documentation

## 2014-01-15 DIAGNOSIS — R1032 Left lower quadrant pain: Secondary | ICD-10-CM | POA: Diagnosis present

## 2014-01-15 DIAGNOSIS — N23 Unspecified renal colic: Secondary | ICD-10-CM

## 2014-01-15 DIAGNOSIS — Z791 Long term (current) use of non-steroidal anti-inflammatories (NSAID): Secondary | ICD-10-CM | POA: Insufficient documentation

## 2014-01-15 LAB — URINALYSIS, ROUTINE W REFLEX MICROSCOPIC
Glucose, UA: NEGATIVE mg/dL
LEUKOCYTES UA: NEGATIVE
Nitrite: NEGATIVE
PH: 5.5 (ref 5.0–8.0)
PROTEIN: 30 mg/dL — AB
Specific Gravity, Urine: >1.03 — ABNORMAL HIGH (ref 1.005–1.030)
UROBILINOGEN UA: 1 mg/dL (ref 0.0–1.0)

## 2014-01-15 LAB — BASIC METABOLIC PANEL
BUN: 9 mg/dL (ref 6–23)
CALCIUM: 9.1 mg/dL (ref 8.4–10.5)
CO2: 24 meq/L (ref 19–32)
Chloride: 106 mEq/L (ref 96–112)
Creat: 0.7 mg/dL (ref 0.50–1.10)
GLUCOSE: 102 mg/dL — AB (ref 70–99)
POTASSIUM: 4.9 meq/L (ref 3.5–5.3)
SODIUM: 139 meq/L (ref 135–145)

## 2014-01-15 LAB — URINE MICROSCOPIC-ADD ON

## 2014-01-15 LAB — LIPID PANEL
Cholesterol: 190 mg/dL (ref 0–200)
HDL: 46 mg/dL (ref >39)
LDL Cholesterol: 128 mg/dL — ABNORMAL HIGH (ref 0–99)
Total CHOL/HDL Ratio: 4.1 Ratio
Triglycerides: 79 mg/dL (ref <150)
VLDL: 16 mg/dL (ref 0–40)

## 2014-01-15 LAB — PREGNANCY, URINE: PREG TEST UR: NEGATIVE

## 2014-01-15 MED ORDER — OXYCODONE-ACETAMINOPHEN 5-325 MG PO TABS: 1.0000 | ORAL_TABLET | Freq: Four times a day (QID) | ORAL | Status: DC | PRN

## 2014-01-15 MED ORDER — HYDROMORPHONE HCL PF 1 MG/ML IJ SOLN
1.0000 mg | Freq: Once | INTRAMUSCULAR | Status: AC
  Administered 2014-01-15: 1 mg via INTRAVENOUS
  Filled 2014-01-15: qty 1

## 2014-01-15 MED ORDER — HYDROMORPHONE HCL PF 1 MG/ML IJ SOLN
1.0000 mg | Freq: Once | INTRAMUSCULAR | Status: AC
  Administered 2014-01-15: 1 mg via INTRAVENOUS

## 2014-01-15 MED ORDER — IBUPROFEN 800 MG PO TABS: 800.0000 mg | ORAL_TABLET | Freq: Three times a day (TID) | ORAL | Status: DC

## 2014-01-15 MED ORDER — ONDANSETRON HCL 4 MG/2ML IJ SOLN
4.0000 mg | Freq: Once | INTRAMUSCULAR | Status: DC
  Filled 2014-01-15: qty 2

## 2014-01-15 MED ORDER — ONDANSETRON HCL 4 MG/2ML IJ SOLN
4.0000 mg | Freq: Once | INTRAMUSCULAR | Status: AC
  Administered 2014-01-15: 4 mg via INTRAVENOUS

## 2014-01-15 MED ORDER — ONDANSETRON 4 MG PO TBDP: 4.0000 mg | ORAL_TABLET | Freq: Four times a day (QID) | ORAL | Status: DC | PRN

## 2014-01-15 MED ORDER — HYDROMORPHONE HCL PF 1 MG/ML IJ SOLN
INTRAMUSCULAR | Status: AC
  Filled 2014-01-15: qty 1

## 2014-01-15 MED ORDER — KETOROLAC TROMETHAMINE 30 MG/ML IJ SOLN
30.0000 mg | Freq: Once | INTRAMUSCULAR | Status: AC
  Administered 2014-01-15: 30 mg via INTRAVENOUS
  Filled 2014-01-15: qty 1

## 2014-01-15 NOTE — ED Notes (Signed)
Lower abdominal  And lower back pain since 730am.  Vomited times 2.

## 2014-01-15 NOTE — Discharge Instructions (Signed)
Your lab tests and CT scan suggests you may be passing a kidney stone. Please strain all urine over the next 2 weeks. Please increase water, juices, Gatorade. Use Percocet and ibuprofen for pain if needed. Use Zofran for nausea if needed. Percocet may cause drowsiness, please use with caution. Please return to the emergency department if any pain that is not controlled by these medications, high fevers, or deterioration in her general condition. Kidney Stones Kidney stones (urolithiasis) are solid masses that form inside your kidneys. The intense pain is caused by the stone moving through the kidney, ureter, bladder, and urethra (urinary tract). When the stone moves, the ureter starts to spasm around the stone. The stone is usually passed in your pee (urine).  HOME CARE  Drink enough fluids to keep your pee clear or pale yellow. This helps to get the stone out.  Strain all pee through the provided strainer. Do not pee without peeing through the strainer, not even once. If you pee the stone out, catch it in the strainer. The stone may be as small as a grain of salt. Take this to your doctor. This will help your doctor figure out what you can do to try to prevent more kidney stones.  Only take medicine as told by your doctor.  Follow up with your doctor as told.  Get follow-up X-rays as told by your doctor. GET HELP IF: You have pain that gets worse even if you have been taking pain medicine. GET HELP RIGHT AWAY IF:   Your pain does not get better with medicine.  You have a fever or shaking chills.  Your pain increases and gets worse over 18 hours.  You have new belly (abdominal) pain.  You feel faint or pass out.  You are unable to pee. MAKE SURE YOU:   Understand these instructions.  Will watch your condition.  Will get help right away if you are not doing well or get worse. Document Released: 10/06/2007 Document Revised: 12/20/2012 Document Reviewed: 09/20/2012 [facility] Patient  Information 2015 [place], [place]. This information is not intended to replace advice given to you by your health care provider. Make sure you discuss any questions you have with your health care provider.

## 2014-01-15 NOTE — ED Notes (Signed)
C/o returned pain. PA aware, verbal order for 1 mg Dilaudid IV obtained.

## 2014-01-15 NOTE — ED Provider Notes (Signed)
CSN: [id number]     Arrival date & time 01/15/14  1113 History   First MD Initiated Contact with Patient 01/15/14 1130     Chief Complaint  Patient presents with  . Abdominal Pain     (Consider location/radiation/quality/duration/timing/severity/associated sxs/prior Treatment) Patient is a 43 y.o. female presenting with abdominal pain. The history is provided by the patient.  Abdominal Pain Pain location:  LLQ and RLQ Pain quality: aching, cramping and shooting   Pain radiates to:  L flank Pain severity:  Severe Onset quality:  Sudden Duration:  3 hours Timing:  Constant Progression:  Worsening Chronicity:  New Context: not alcohol use, not previous surgeries and not trauma   Relieved by:  Nothing Ineffective treatments:  Lying down and OTC medications Associated symptoms: nausea and vomiting   Associated symptoms: no chest pain, no chills, no constipation, no cough, no diarrhea, no dysuria, no fever, no hematochezia, no hematuria, no shortness of breath, no vaginal bleeding and no vaginal discharge   Risk factors: no alcohol abuse, no aspirin use, has not had multiple surgeries, no NSAID use and no recent hospitalization     History reviewed. No pertinent past medical history. Past Surgical History  Procedure Laterality Date  . Cesarean section     Family History  Problem Relation Age of Onset  . Heart disease Father   . Hypertension Father    History  Substance Use Topics  . Smoking status: Current Some Day Smoker -- 0.25 packs/day    Types: Cigarettes  . Smokeless tobacco: Never Used  . Alcohol Use: No   OB History   Grav Para Term Preterm Abortions TAB SAB Ect Mult Living                 Review of Systems  Constitutional: Negative for fever, chills and activity change.       All ROS Neg except as noted in HPI  HENT: Negative for nosebleeds.   Eyes: Negative for photophobia and discharge.  Respiratory: Negative for cough, shortness of breath and wheezing.    Cardiovascular: Negative for chest pain, palpitations and leg swelling.  Gastrointestinal: Positive for nausea, vomiting and abdominal pain. Negative for diarrhea, constipation, blood in stool and hematochezia.  Genitourinary: Negative for dysuria, frequency, hematuria, vaginal bleeding and vaginal discharge.  Musculoskeletal: Negative for arthralgias, back pain and neck pain.  Skin: Negative.   Neurological: Negative for dizziness, seizures and speech difficulty.  Psychiatric/Behavioral: Negative for hallucinations and confusion.      Allergies  Review of patient's allergies indicates no known allergies.  Home Medications   Prior to Admission medications   Medication Sig Start Date End Date Taking? Authorizing Provider  CRYSELLE-28 0.3-30 MG-MCG tablet TAKE 1 TABLET ONCE DAILY AS DIRECTED. 10/29/13  Yes Babs Sciara, MD  lisinopril (PRINIVIL,ZESTRIL) 10 MG tablet Take 1 tablet (10 mg total) by mouth daily. 08/13/13  Yes Campbell Riches, NP  naproxen sodium (ALEVE) 220 MG tablet Take 660 mg by mouth daily as needed (pain).   Yes Historical Provider, MD   BP 137/84  Pulse 71  Temp(Src) 97.8 F (36.6 C) (Oral)  Resp 18  Ht  (1.6 m)  Wt 165 lb (74.844 kg)  BMI 29.24 kg/m2  SpO2 97%  LMP 12/19/2013 Physical Exam  Nursing note and vitals reviewed. Constitutional: She is oriented to person, place, and time. She appears well-developed and well-nourished.  Non-toxic appearance.  HENT:  Head: Normocephalic.  Right Ear: Tympanic membrane and external ear normal.  Left Ear: Tympanic membrane and external ear normal.  Eyes: EOM and lids are normal. Pupils are equal, round, and reactive to light.  Neck: Normal range of motion. Neck supple. Carotid bruit is not present.  Cardiovascular: Normal rate, regular rhythm, normal heart sounds, intact distal pulses and normal pulses.   Pulmonary/Chest: Breath sounds normal. No respiratory distress. She has no wheezes. She has no rales.   Abdominal: Soft. Bowel sounds are normal. She exhibits no distension. There is no rebound and no guarding.  Mild to mod left CVAT Left greater than right lower abd pain. Suprapubic pain.  Musculoskeletal: Normal range of motion.  Lymphadenopathy:       Head (right side): No submandibular adenopathy present.       Head (left side): No submandibular adenopathy present.    She has no cervical adenopathy.  Neurological: She is alert and oriented to person, place, and time. She has normal strength. No cranial nerve deficit or sensory deficit.  Skin: Skin is warm and dry.  Psychiatric: She has a normal mood and affect. Her speech is normal.    ED Course  Procedures (including critical care time) Labs Review Labs Reviewed  URINALYSIS, ROUTINE W REFLEX MICROSCOPIC - Abnormal; Notable for the following:    APPearance CLOUDY (*)    Specific Gravity, Urine >1.030 (*)    Hgb urine dipstick LARGE (*)    Bilirubin Urine SMALL (*)    Ketones, ur TRACE (*)    Protein, ur 30 (*)    All other components within normal limits  URINE MICROSCOPIC-ADD ON - Abnormal; Notable for the following:    Bacteria, UA MANY (*)    All other components within normal limits  PREGNANCY, URINE    Imaging Review No results found. Results for orders placed during the hospital encounter of 01/15/14  PREGNANCY, URINE      Result Value Ref Range   Preg Test, Ur NEGATIVE  NEGATIVE  URINALYSIS, ROUTINE W REFLEX MICROSCOPIC      Result Value Ref Range   Color, Urine YELLOW  YELLOW   APPearance CLOUDY (*) CLEAR   Specific Gravity, Urine >1.030 (*) 1.005 - 1.030   pH 5.5  5.0 - 8.0   Glucose, UA NEGATIVE  NEGATIVE mg/dL   Hgb urine dipstick LARGE (*) NEGATIVE   Bilirubin Urine SMALL (*) NEGATIVE   Ketones, ur TRACE (*) NEGATIVE mg/dL   Protein, ur 30 (*) NEGATIVE mg/dL   Urobilinogen, UA 1.0  0.0 - 1.0 mg/dL   Nitrite NEGATIVE  NEGATIVE   Leukocytes, UA NEGATIVE  NEGATIVE  URINE MICROSCOPIC-ADD ON      Result  Value Ref Range   Squamous Epithelial / LPF RARE  RARE   RBC / HPF TOO NUMEROUS TO COUNT  <3 RBC/hpf   Bacteria, UA MANY (*) RARE   Ct Abdomen Pelvis Wo Contrast  01/15/2014   CLINICAL DATA:  Bilateral lower quadrant pain  EXAM: CT ABDOMEN AND PELVIS WITHOUT CONTRAST  TECHNIQUE: Multidetector CT imaging of the abdomen and pelvis was performed following the standard protocol without IV contrast.  COMPARISON:  03/02/2013  FINDINGS: Sagittal images of the spine are unremarkable. Mild atherosclerotic calcifications of distal aorta and right common iliac artery.  Lung bases are unremarkable.  Unenhanced liver, pancreas, spleen and adrenal glands are unremarkable.  There is mild left hydronephrosis. Minimal prominence of left ureter without frank hydroureter. No nephrolithiasis. No calcified ureteral calculi are noted bilaterally.  The urinary bladder is empty limiting its assessment. Bilateral  distal ureter is unremarkable. Unenhanced uterus is unremarkable.  No small bowel obstruction. No ascites or free air. No adenopathy. Normal appendix. No pericecal inflammation. Small nonspecific bilateral inguinal lymph nodes.  IMPRESSION: 1. There is mild left hydronephrosis and minimal prominence of left ureter. No calcified ureteral calculi are noted. Findings may be due to a recent passed left ureteral calculus or left urinary tract inflammation. Clinical correlation is necessary. 2. No small bowel obstruction. 3. No pericecal inflammation.  Normal appendix. 4. Limited assessment of the urinary bladder which is empty.   Electronically Signed   By: Natasha Mead M.D.   On: 01/15/2014 13:15     EKG Interpretation None     No results found for this or any previous visit (from the past 72 hour(s)).  MDM  Vital signs are within normal limits.    Pregnancy test is negative.  There is no distention, and no changes in the bowel sounds. Doubt obstruction.  Urinalysis shows a cloudy yellow specimen with a specific  gravity of greater than 1.030, hemoglobin large, trace of ketones, 30 mg per decaliter proteins. There are too many to count red cells, and many bacteria present.  CT scan shows no significant abnormality of the liver, pancreas, spleen, and adrenal glands on an unenhanced CT scan. There is mild left hydronephrosis and a prominence of the left ureter. There is no calcified ureteral calculi noted. There is no bowel obstruction appreciated. The appendix appears normal.  Suspect the patient has passed a non-calcified ureteral calculi with ureteral colic. I have discussed these findings and my diagnosis with the patient in terms which he understands. The patient will strain all urine. Prescription for Motrin 800 mg and Percocet 5 mg given to the patient. Patient is also given Zofran ODT in the event that nausea should recur. She is referred to [facility] specialist for additional evaluation of this problem. She is invited to return to the emergency department immediately if any changes, problems, or concerns.    Final diagnoses:  Ureteral colic    **I have reviewed nursing notes, vital signs, and all appropriate lab and imaging results for this patient.Kathie Dike, PA-C 01/18/14 1754

## 2014-01-18 NOTE — ED Provider Notes (Signed)
Medical screening examination/treatment/procedure(s) were performed by non-physician practitioner and as supervising physician I was immediately available for consultation/collaboration.   EKG Interpretation None        Nyelli Samara W Legna Mausolf, MD 01/18/14 2303 

## 2014-03-05 ENCOUNTER — Other Ambulatory Visit: Payer: Self-pay | Admitting: Nurse Practitioner

## 2014-04-12 ENCOUNTER — Other Ambulatory Visit: Payer: Self-pay | Admitting: Family Medicine

## 2014-05-30 ENCOUNTER — Other Ambulatory Visit: Payer: Self-pay | Admitting: Obstetrics and Gynecology

## 2014-05-31 LAB — CYTOLOGY - PAP

## 2014-09-30 ENCOUNTER — Other Ambulatory Visit: Payer: Self-pay | Admitting: Nurse Practitioner

## 2014-10-01 NOTE — Telephone Encounter (Signed)
Needs office visit.

## 2014-10-30 IMAGING — US US PELVIS LIMITED
1 series · 14 of 25 positions shown · non-contrast
Comparison: None.

CLINICAL DATA: Palpable abnormality lower back

US PELVIS LIMITED OR FOLLOW UP

[Series 1: us pelvis limited · 0.07mm/px · 30 acquisitions, 14 frames shown]
[im 1/30]
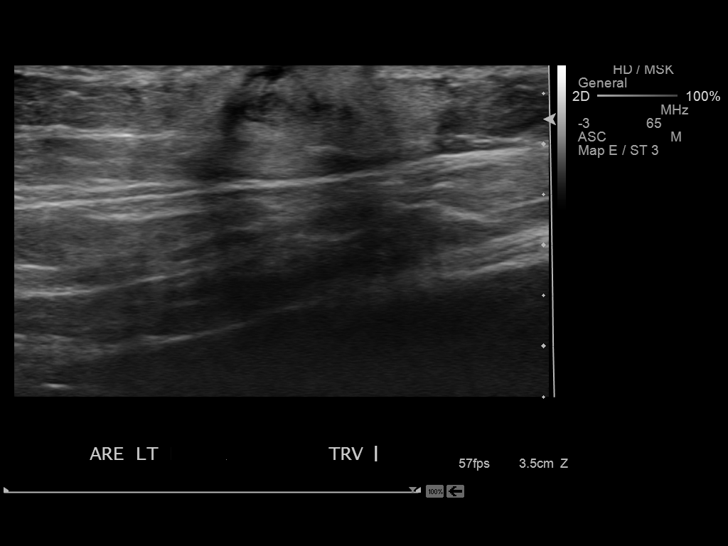
[im 3/30]
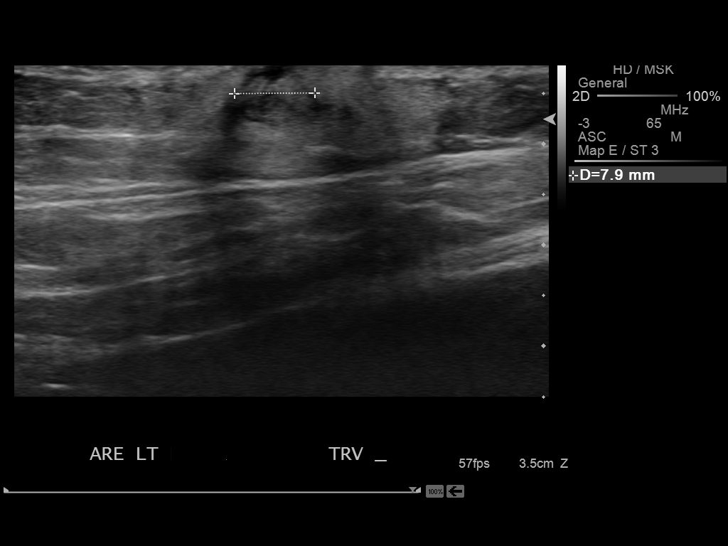
[im 5/30]
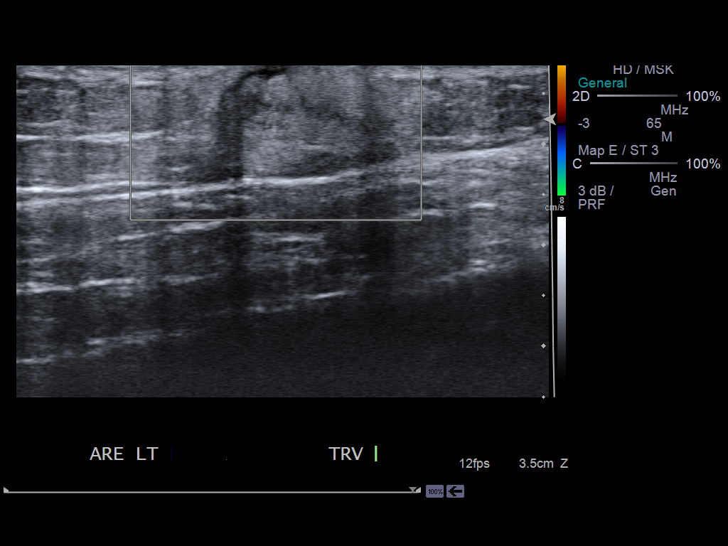
[im 8/30]
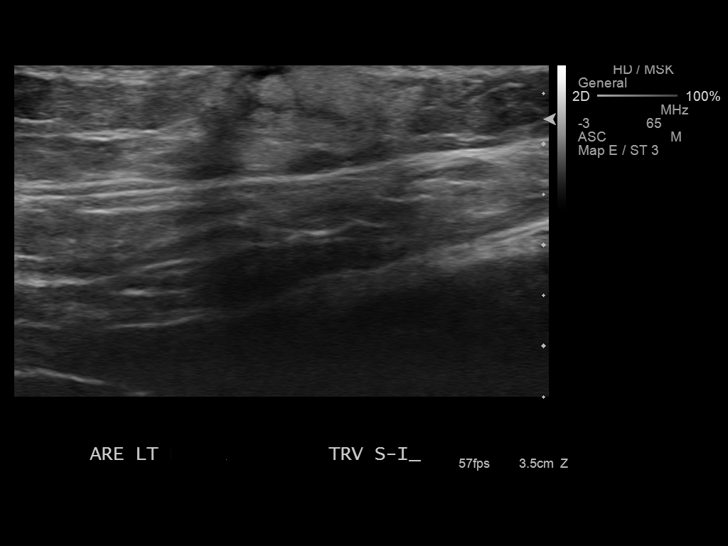
[im 10/30]
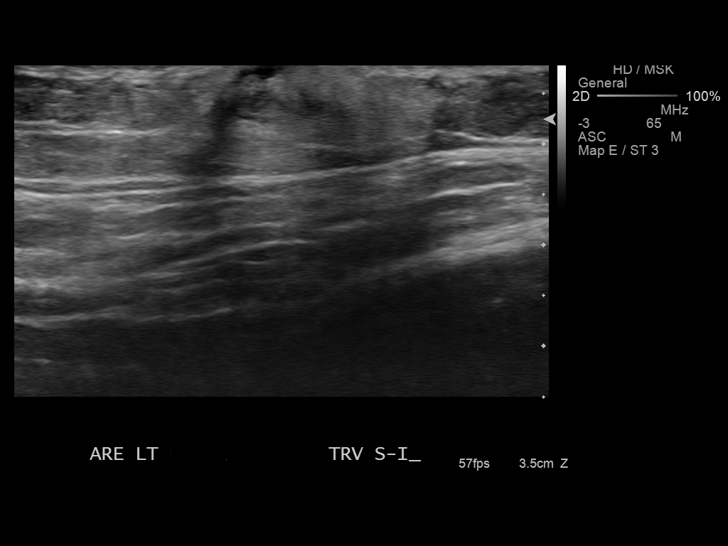
[im 11/30]
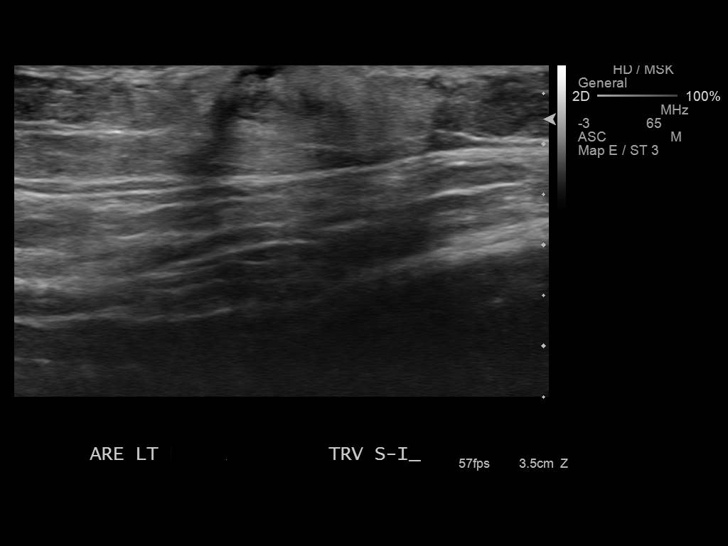
[im 14/30]
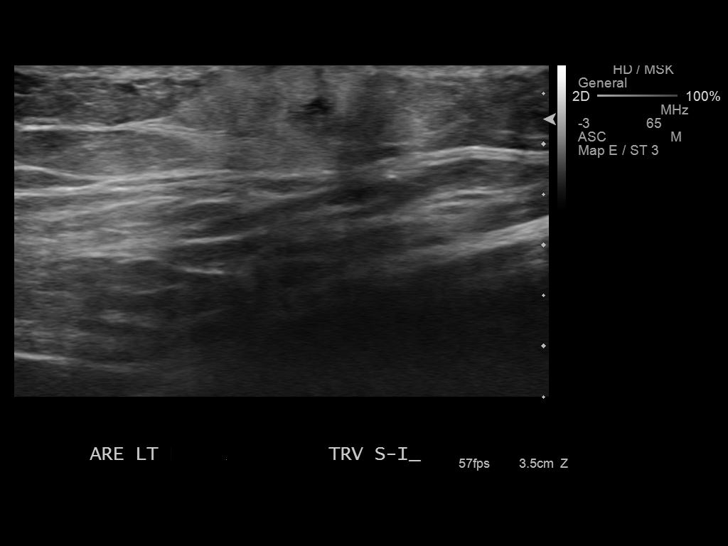
[im 16/30]
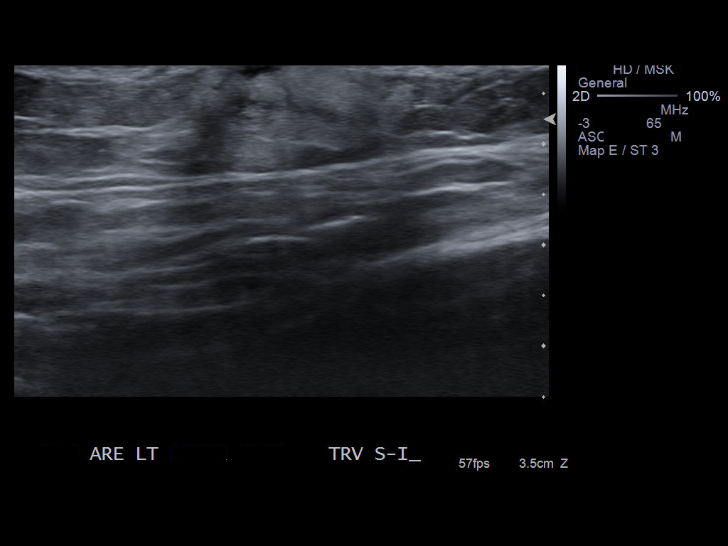
[im 19/30]
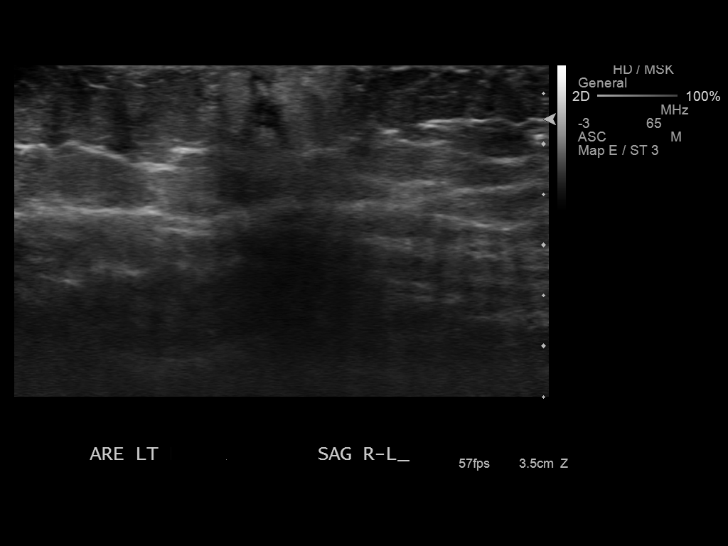
[im 20/30]
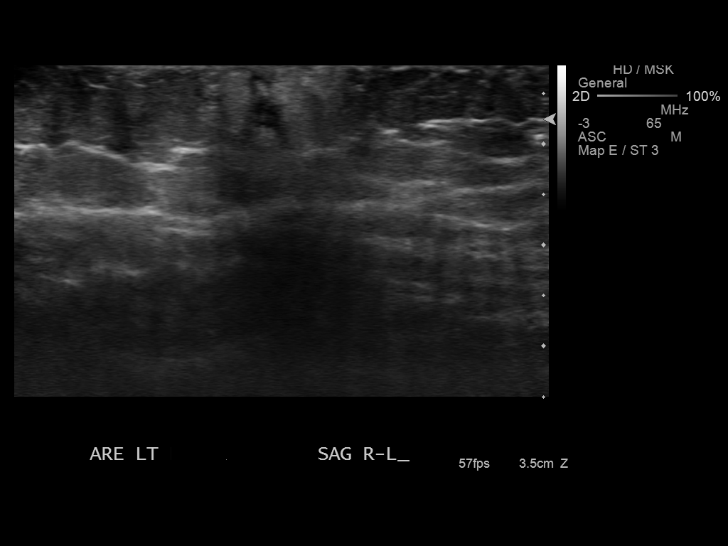
[im 22/30]
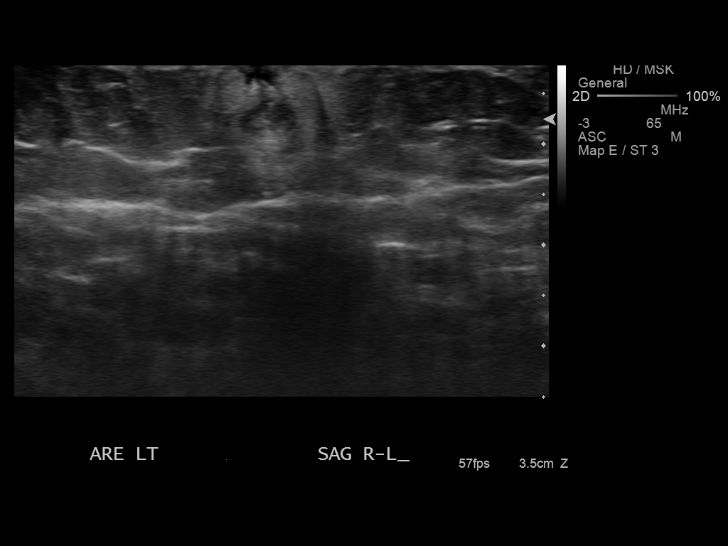
[im 25/30]
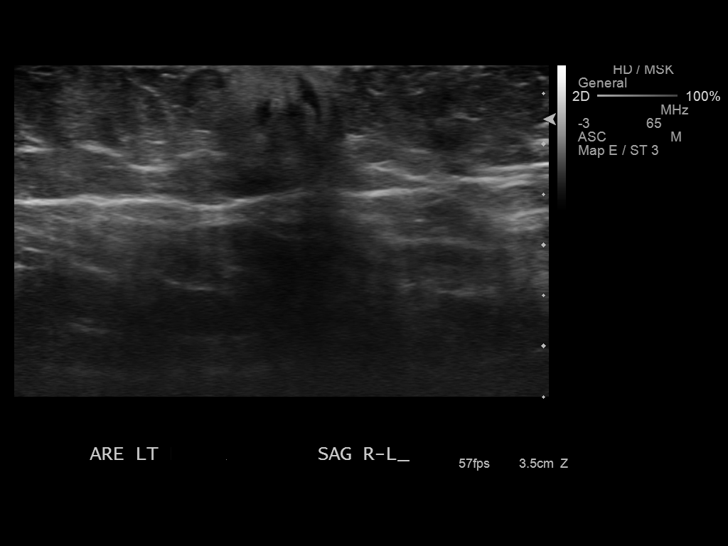
[im 27/30]
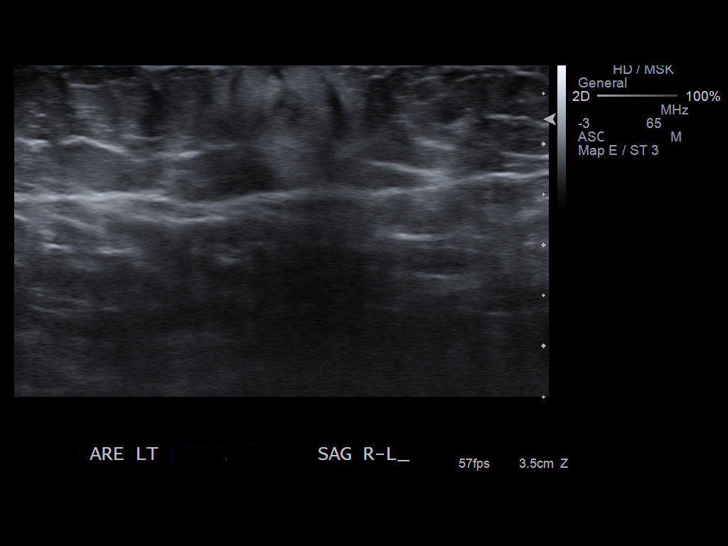
[im 30/30]
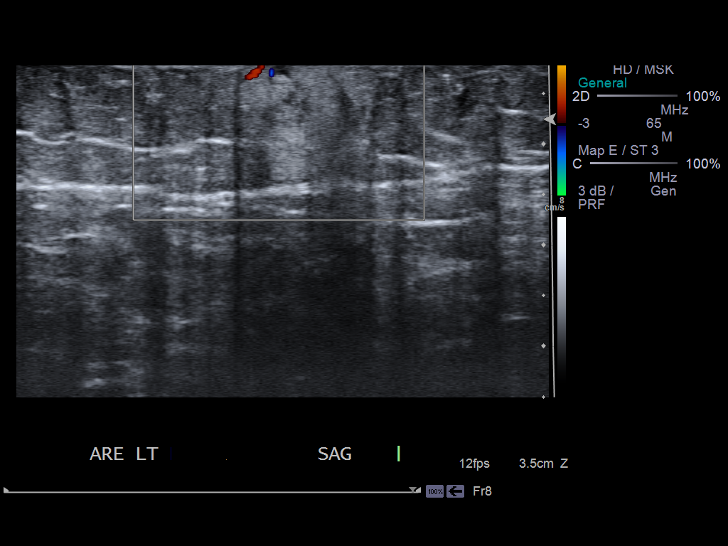

[14 of 25 positions shown; findings below may reference images not displayed]

FINDINGS: Sonography of the palpable abnormality at the lower back/upper
pelvis performed.
At the site of clinical concern, heterogeneous cutaneous nodule is
identified with a small tract extending into the dermis.
Lesion measures 8 x 8 x 6 mm in size.
This does not have the sonographic features of a simple lipoma or a
simple cyst.
No shadowing calcification identified.
No hyperemia seen on color Doppler imaging.
IMPRESSION: 8 x 8 x 6 mm subcutaneous nodule with hypo echogenicity extending
into the dermis at site of clinical concern at the lower left
back/upper pelvis.
This has nonspecific sonographic features but does not appear
inflammatory.
Differential diagnosis includes a sebaceous cyst and epidermal
inclusion cyst etiologies are not excluded.
Clinical management recommended.

## 2014-11-11 ENCOUNTER — Other Ambulatory Visit: Payer: Self-pay | Admitting: Family Medicine

## 2014-11-11 ENCOUNTER — Other Ambulatory Visit: Payer: Self-pay | Admitting: Nurse Practitioner

## 2014-11-11 NOTE — Telephone Encounter (Signed)
Needs office visit.

## 2014-12-23 ENCOUNTER — Other Ambulatory Visit: Payer: Self-pay | Admitting: Nurse Practitioner

## 2015-01-28 ENCOUNTER — Other Ambulatory Visit: Payer: Self-pay | Admitting: Family Medicine

## 2015-01-28 MED ORDER — LISINOPRIL 10 MG PO TABS: 10.0000 mg | ORAL_TABLET | Freq: Every day | ORAL | Status: DC

## 2015-01-28 NOTE — Telephone Encounter (Signed)
Patient needs refill on lisinopril 10 mg, she has three pills left has appointment on 10/17 for med check. [facility]

## 2015-01-28 NOTE — Telephone Encounter (Signed)
Rx sent electronically to pharmacy. Patient notified. 

## 2015-02-17 ENCOUNTER — Encounter: Payer: Self-pay | Admitting: Nurse Practitioner

## 2015-02-17 ENCOUNTER — Ambulatory Visit (INDEPENDENT_AMBULATORY_CARE_PROVIDER_SITE_OTHER): Payer: Commercial Indemnity | Admitting: Nurse Practitioner

## 2015-02-17 VITALS — BP 116/78 | Wt 185.0 lb

## 2015-02-17 DIAGNOSIS — R079 Chest pain, unspecified: Secondary | ICD-10-CM | POA: Diagnosis not present

## 2015-02-17 DIAGNOSIS — I10 Essential (primary) hypertension: Secondary | ICD-10-CM | POA: Diagnosis not present

## 2015-02-17 DIAGNOSIS — R233 Spontaneous ecchymoses: Secondary | ICD-10-CM | POA: Diagnosis not present

## 2015-02-17 DIAGNOSIS — R5383 Other fatigue: Secondary | ICD-10-CM

## 2015-02-17 MED ORDER — LISINOPRIL 10 MG PO TABS: 10.0000 mg | ORAL_TABLET | Freq: Every day | ORAL | Status: DC

## 2015-02-17 NOTE — Progress Notes (Signed)
Subjective:  Presents for routine follow up of her hypertension. No unusual SOB. Has increased her activity. Slow weight gain. Increased fatigue. Has occasional red areas on her skin that come and go about once a month; has a picture of several small petechiae on one of her forearms. Stress level improved. Also c/o chronic chest pain for at least 6-8 months. Occurs in different areas of the chest. Occurs everyday. Can last an hour or more. Unassociated with any particular activity. Can occur with activity and at rest. Does not seem to be positional. Occasional palpitations or pounding heart rate. Chest wall is always sore. Relieved with Aleve. Smokes about 7-8 cigarettes per week. Continues to take oc's per GYN. Had one episode of heartburn recently, the first she has had in years. FMH: father, both grandfathers all had history of CVD and MI; pgrandmother had a stroke.   Objective:   BP 116/78 mmHg  Wt 185 lb (83.915 kg) NAD. Alert, oriented. Lungs clear. Heart RRR without murmur or gallop. No chest wall tenderness to palpation. Abdomen soft, non tender. Lower extremities trace pitting edema. EKG normal.   Assessment:  Problem List Items Addressed This Visit      Cardiovascular and Mediastinum   Essential hypertension, benign   Relevant Medications   lisinopril (PRINIVIL,ZESTRIL) 10 MG tablet   Other Relevant Orders   Basic metabolic panel    Other Visit Diagnoses    Chest pain, unspecified chest pain type    -  Primary    Relevant Orders    PR ELECTROCARDIOGRAM, COMPLETE    Ambulatory referral to Cardiology    Other fatigue        Relevant Orders    CBC with Differential/Platelet    Hepatic function panel    TSH    Vit D  25 hydroxy (rtn osteoporosis monitoring)    Petechiae           Plan:  Meds ordered this encounter  Medications  . lisinopril (PRINIVIL,ZESTRIL) 10 MG tablet    Sig: Take 1 tablet (10 mg total) by mouth daily.    Dispense:  90 tablet    Refill:  1    Order  Specific Question:  Supervising Provider    Answer:  Merlyn AlbertLUKING, WILLIAM S [[id number]]   Strongly recommend that she stop smoking due to her age and use of estrogen in her oc. Also recommend she talk with gynecologist about reducing amount of estrogen in pill. Lab work pending. Although her chest pain is most likely musculoskeletal in origin, recommend cardiology referral for evaluation. Call or go to ED if symptoms worsen,  Return in about 6 months (around 08/18/2015) for recheck on BP.

## 2015-02-18 LAB — CBC WITH DIFFERENTIAL/PLATELET
BASOS ABS: 0 10*3/uL (ref 0.0–0.2)
Basos: 0 %
EOS (ABSOLUTE): 0.3 10*3/uL (ref 0.0–0.4)
Eos: 4 %
Hematocrit: 46.8 % — ABNORMAL HIGH (ref 34.0–46.6)
Hemoglobin: 15.2 g/dL (ref 11.1–15.9)
Immature Grans (Abs): 0 10*3/uL (ref 0.0–0.1)
Immature Granulocytes: 0 %
Lymphocytes Absolute: 2.6 10*3/uL (ref 0.7–3.1)
Lymphs: 29 %
MCH: 30.1 pg (ref 26.6–33.0)
MCHC: 32.5 g/dL (ref 31.5–35.7)
MCV: 93 fL (ref 79–97)
MONOCYTES: 7 %
MONOS ABS: 0.6 10*3/uL (ref 0.1–0.9)
NEUTROS ABS: 5.4 10*3/uL (ref 1.4–7.0)
Neutrophils: 60 %
PLATELETS: 305 10*3/uL (ref 150–379)
RBC: 5.05 x10E6/uL (ref 3.77–5.28)
RDW: 13.2 % (ref 12.3–15.4)
WBC: 9 10*3/uL (ref 3.4–10.8)

## 2015-02-18 LAB — BASIC METABOLIC PANEL
BUN/Creatinine Ratio: 12 (ref 9–23)
BUN: 8 mg/dL (ref 6–24)
CALCIUM: 10 mg/dL (ref 8.7–10.2)
CO2: 22 mmol/L (ref 18–29)
CREATININE: 0.65 mg/dL (ref 0.57–1.00)
Chloride: 103 mmol/L (ref 97–106)
GFR calc Af Amer: 126 mL/min/{1.73_m2} (ref >59)
GFR calc non Af Amer: 109 mL/min/{1.73_m2} (ref >59)
GLUCOSE: 82 mg/dL (ref 65–99)
Potassium: 5 mmol/L (ref 3.5–5.2)
SODIUM: 140 mmol/L (ref 136–144)

## 2015-02-18 LAB — HEPATIC FUNCTION PANEL
ALBUMIN: 3.9 g/dL (ref 3.5–5.5)
ALK PHOS: 60 IU/L (ref 39–117)
ALT: 15 IU/L (ref 0–32)
AST: 14 IU/L (ref 0–40)
Bilirubin Total: <0.2 mg/dL (ref 0.0–1.2)
Bilirubin, Direct: 0.03 mg/dL (ref 0.00–0.40)
Total Protein: 6.5 g/dL (ref 6.0–8.5)

## 2015-02-18 LAB — TSH: TSH: 1.17 u[IU]/mL (ref 0.450–4.500)

## 2015-02-18 LAB — VITAMIN D 25 HYDROXY (VIT D DEFICIENCY, FRACTURES): Vit D, 25-Hydroxy: 29.8 ng/mL — ABNORMAL LOW (ref 30.0–100.0)

## 2015-02-27 ENCOUNTER — Ambulatory Visit: Payer: Commercial Indemnity | Admitting: Cardiology

## 2015-02-27 ENCOUNTER — Encounter: Payer: Self-pay | Admitting: Family Medicine

## 2015-03-05 ENCOUNTER — Ambulatory Visit (INDEPENDENT_AMBULATORY_CARE_PROVIDER_SITE_OTHER): Payer: Commercial Indemnity | Admitting: Cardiology

## 2015-03-05 ENCOUNTER — Encounter: Payer: Self-pay | Admitting: Cardiology

## 2015-03-05 VITALS — BP 122/92 | HR 94 | Ht 63.0 in | Wt 182.0 lb

## 2015-03-05 DIAGNOSIS — I10 Essential (primary) hypertension: Secondary | ICD-10-CM

## 2015-03-05 DIAGNOSIS — Z720 Tobacco use: Secondary | ICD-10-CM

## 2015-03-05 DIAGNOSIS — R072 Precordial pain: Secondary | ICD-10-CM

## 2015-03-05 DIAGNOSIS — Z8249 Family history of ischemic heart disease and other diseases of the circulatory system: Secondary | ICD-10-CM | POA: Diagnosis not present

## 2015-03-05 NOTE — Patient Instructions (Signed)
Medication Instructions:  Your physician recommends that you continue on your current medications as directed. Please refer to the Current Medication list given to you today.   Labwork: NONE  Testing/Procedures: Your physician has requested that you have a stress echocardiogram. For further information please visit [email or web address]. Please follow instruction sheet as given.    Follow-Up: TO BE DETERMINED  Any Other Special Instructions Will Be Listed Below (If Applicable). We will call you with test results     If you need a refill on your cardiac medications before your next appointment, please call your pharmacy. Thanks for choosing [facility]!!!

## 2015-03-05 NOTE — Progress Notes (Signed)
Cardiology Office Note  Date: 03/05/2015   ID: Robin Ball, DOB Oct 30, 1970, MRN [id number]  PCP: Lilyan PuntScott Luking, MD  Consulting Cardiologist: Nona DellSamuel Logen Heintzelman, MD   Chief Complaint  Patient presents with  . Chest Pain    History of Present Illness: Robin Ball is a 44 y.o. female referred for cardiology consultation by Ms. Hoskins NP. I reviewed the recent notes. She presents with approximately 2 year history of recurring thoracic discomfort. She describes it as a dull tightness, typically very prolonged, often associated with emotional stress, not exacerbated by exertion. She has a discomfort in the left upper portion of the chest and shoulder, sometimes under the left arm or in the upper left arm. She usually takes aspirin or Aleve and symptoms typically resolve after a period of hours.  She works full time at [employer], does describe stress at work. She had less of her usual chest discomfort while she was on a recent 10 day vacation.  She does have a long-standing history of hypertension since age 89. Also family history of premature CAD, her father developed heart problems in his 30s. She has not undergone any risk stratification testing. Recent resting ECG was normal.   Past Medical History  Diagnosis Date  . Essential hypertension     Past Surgical History  Procedure Laterality Date  . Cesarean section      Current Outpatient Prescriptions  Medication Sig Dispense Refill  . CRYSELLE-28 0.3-30 MG-MCG tablet TAKE 1 TABLET ONCE DAILY AS DIRECTED. 84 tablet 0  . ibuprofen (ADVIL,MOTRIN) 800 MG tablet Take 1 tablet (800 mg total) by mouth 3 (three) times daily. 21 tablet 0  . lisinopril (PRINIVIL,ZESTRIL) 10 MG tablet Take 1 tablet (10 mg total) by mouth daily. 90 tablet 1  . naproxen sodium (ALEVE) 220 MG tablet Take 660 mg by mouth daily as needed (pain).     No current facility-administered medications for this visit.    Allergies:  Review of patient's  allergies indicates no known allergies.   Social History: The patient  reports that she has been smoking Cigarettes.  She has been smoking about 0.25 packs per day. She has never used smokeless tobacco. She reports that she does not drink alcohol or use illicit drugs.   Family History: The patient's family history includes Heart disease in her father, maternal grandfather, and paternal grandfather; Hypertension in her father.   ROS:  Please see the history of present illness. Otherwise, complete review of systems is positive for none.  All other systems are reviewed and negative.   Physical Exam: VS:  BP 122/92 mmHg  Pulse 94  Ht 5\' 3"  (1.6 m)  Wt 182 lb (82.555 kg)  BMI 32.25 kg/m2  SpO2 97%  LMP 03/02/2015, BMI Body mass index is 32.25 kg/(m^2).  Wt Readings from Last 3 Encounters:  03/05/15 182 lb (82.555 kg)  02/17/15 185 lb (83.915 kg)  01/15/14 165 lb (74.844 kg)     General: Patient appears comfortable at rest. HEENT: Conjunctiva and lids normal, oropharynx clear with moist mucosa. Neck: Supple, no elevated JVP or carotid bruits, no thyromegaly. Lungs: Clear to auscultation, nonlabored breathing at rest. Cardiac: Regular rate and rhythm, no S3 or significant systolic murmur, no pericardial rub. Abdomen: Soft, nontender, no hepatomegaly, bowel sounds present, no guarding or rebound. Extremities: No pitting edema, distal pulses 2+. Skin: Warm and dry. Musculoskeletal: No kyphosis. Neuropsychiatric: Alert and oriented x3, affect grossly appropriate.   ECG: Tracing from 02/17/2015 reviewed  and showed normal sinus rhythm.  Recent Labwork: 02/17/2015: ALT 15; AST 14; BUN 8; Creatinine, Ser 0.65; Potassium 5.0; Sodium 140; TSH 1.170     Component Value Date/Time   CHOL 190 01/15/2014 0807   TRIG 79 01/15/2014 0807   HDL 46 01/15/2014 0807   CHOLHDL 4.1 01/15/2014 0807   VLDL 16 01/15/2014 0807   LDLCALC 128* 01/15/2014 0807    Other Studies Reviewed Today:  CT  abdomen and pelvis 01/15/2014: FINDINGS: Sagittal images of the spine are unremarkable. Mild atherosclerotic calcifications of distal aorta and right common iliac artery.  Lung bases are unremarkable.  Unenhanced liver, pancreas, spleen and adrenal glands are unremarkable.  There is mild left hydronephrosis. Minimal prominence of left ureter without frank hydroureter. No nephrolithiasis. No calcified ureteral calculi are noted bilaterally.  The urinary bladder is empty limiting its assessment. Bilateral distal ureter is unremarkable. Unenhanced uterus is unremarkable.  No small bowel obstruction. No ascites or free air. No adenopathy. Normal appendix. No pericecal inflammation. Small nonspecific bilateral inguinal lymph nodes.  IMPRESSION: 1. There is mild left hydronephrosis and minimal prominence of left ureter. No calcified ureteral calculi are noted. Findings may be due to a recent passed left ureteral calculus or left urinary tract inflammation. Clinical correlation is necessary. 2. No small bowel obstruction. 3. No pericecal inflammation. Normal appendix. 4. Limited assessment of the urinary bladder which is empty.   ASSESSMENT AND PLAN:  1. Atypical chest pain in a 44 year old woman with family history of premature CAD (father in his 30s), long-standing hypertension, and tobacco use. I also see incidental mention of mild atherosclerotic plaque in the aortic and iliac distribution by abdominal CT imaging from last year. With this risk factor profile, we will proceed with an exercise echocardiogram for further evaluation. If testing is low risk, would recommend aggressive risk factor modification strategies and continued follow-up with primary care. Otherwise, we can see her back and discuss further evaluation.  2. Essential hypertension, currently on Prinivil.  Current medicines were reviewed at length with the patient today.   Orders Placed This Encounter    Procedures  . Echo stress    Disposition: Call with results.   Signed, Jonelle Sidle, MD, [facility] 03/05/2015 9:50 AM    [facility] [address] [place], [place] [address] Phone: [phone]; Fax: [phone]

## 2015-03-12 ENCOUNTER — Encounter (HOSPITAL_COMMUNITY): Payer: Self-pay

## 2015-03-12 ENCOUNTER — Ambulatory Visit (HOSPITAL_COMMUNITY)
Admission: RE | Admit: 2015-03-12 | Discharge: 2015-03-12 | Disposition: A | Discharge status: Home / Self Care | Payer: Commercial Indemnity | Source: Ambulatory Visit | Attending: Cardiology | Admitting: Cardiology

## 2015-03-12 DIAGNOSIS — R072 Precordial pain: Secondary | ICD-10-CM | POA: Insufficient documentation

## 2015-03-12 DIAGNOSIS — Z8249 Family history of ischemic heart disease and other diseases of the circulatory system: Secondary | ICD-10-CM

## 2015-03-12 LAB — ECHOCARDIOGRAM STRESS TEST
CSEPEW: 11.5 METS
CSEPHR: 97 %
CSEPPHR: 173 {beats}/min
Exercise duration (min): 9 min
Exercise duration (sec): 44 s
MPHR: 177 {beats}/min
RPE: 13
Rest HR: 85 {beats}/min

## 2015-03-13 ENCOUNTER — Telehealth: Payer: Self-pay

## 2015-03-13 NOTE — Telephone Encounter (Signed)
Left message on personal voicemail explaining detailed test results. Left my name & phone number also in case she has any questions. Copy to pcp

## 2015-03-13 NOTE — Telephone Encounter (Signed)
-----   Message from Jonelle SidleSamuel G McDowell, MD sent at 03/12/2015  3:56 PM EST ----- Reviewed. Please let her know that the stress test looked good, low risk for major adverse cardiac events at this time. Continue follow-up with PCP and address modifiable risk factors.

## 2015-04-19 ENCOUNTER — Other Ambulatory Visit: Payer: Self-pay | Admitting: Nurse Practitioner

## 2015-04-29 ENCOUNTER — Ambulatory Visit (INDEPENDENT_AMBULATORY_CARE_PROVIDER_SITE_OTHER): Payer: Managed Care, Other (non HMO) | Admitting: Family Medicine

## 2015-04-29 ENCOUNTER — Encounter: Payer: Self-pay | Admitting: Family Medicine

## 2015-04-29 VITALS — Ht 62.25 in | Wt 184.8 lb

## 2015-04-29 DIAGNOSIS — J069 Acute upper respiratory infection, unspecified: Secondary | ICD-10-CM | POA: Diagnosis not present

## 2015-04-29 DIAGNOSIS — J019 Acute sinusitis, unspecified: Secondary | ICD-10-CM

## 2015-04-29 DIAGNOSIS — M7712 Lateral epicondylitis, left elbow: Secondary | ICD-10-CM | POA: Diagnosis not present

## 2015-04-29 DIAGNOSIS — B9689 Other specified bacterial agents as the cause of diseases classified elsewhere: Secondary | ICD-10-CM

## 2015-04-29 MED ORDER — MELOXICAM 15 MG PO TABS: 15.0000 mg | ORAL_TABLET | Freq: Every day | ORAL | Status: DC

## 2015-04-29 MED ORDER — AMOXICILLIN 500 MG PO TABS: 500.0000 mg | ORAL_TABLET | Freq: Three times a day (TID) | ORAL | Status: DC

## 2015-04-29 NOTE — Patient Instructions (Addendum)
Robin Ball , I believe that you have a mild head cold with secondary sinusitis. Amoxicillin 500 mg, 1 tablet, 3 times daily for 10 days    tennis elbow- cold compresses 10 minutes at a time to 3 times per day, gentle stretching exercises, prescription anti-inflammatory meloxicam 1 daily for the next 2-3 weeks. If not dramatically better over the next few weeks the next step would be injections. Please call us if problems.    Tennis Elbow Tennis elbow (lateral epicondylitis) is inflammation of the outer tendons of your forearm close to your elbow. Your tendons attach your muscles to your bones. The outer tendons of your forearm are used to extend your wrist, and they attach on the outside part of your elbow. Tennis elbow is often found in people who play tennis, but anyone may get the condition from repeatedly extending the wrist or turning the forearm. CAUSES This condition is caused by repeatedly extending your wrist and using your hands. It can result from sports or work that requires repetitive forearm movements. Tennis elbow may also be caused by an injury. RISK FACTORS You have a higher risk of developing tennis elbow if you play tennis or another racquet sport. You also have a higher risk if you frequently use your hands for work. This condition is also more likely to develop in:  Musicians.  Carpenters, painters, and plumbers.  Cooks.  Cashiers.  People who work in [employer].  Holiday representative workers.  Butchers.  People who use computers. SYMPTOMS Symptoms of this condition include:  Pain and tenderness in your forearm and the outer part of your elbow. You may only feel the pain when you use your arm, or you may feel it even when you are not using your arm.  A burning feeling that runs from your elbow through your arm.  Weak grip in your hands. DIAGNOSIS  This condition may be diagnosed by medical history and physical exam. You may also have other tests,  including:  X-rays.  MRI. TREATMENT Your health care provider will recommend lifestyle adjustments, such as resting and icing your arm. Treatment may also include:  Medicines for inflammation. This may include shots of cortisone if your pain continues.  Physical therapy. This may include massage or exercises.  An elbow brace. Surgery may eventually be recommended if your pain does not go away with treatment. HOME CARE INSTRUCTIONS Activity  Rest your elbow and wrist as directed by your health care provider. Try to avoid any activities that caused the problem until your health care provider says that you can do them again.  If a physical therapist teaches you exercises, do all of them as directed.  If you lift an object, lift it with your palm facing upward. This lowers the stress on your elbow. Lifestyle  If your tennis elbow is caused by sports, check your equipment and make sure that:  You are using it correctly.  It is the best fit for you.  If your tennis elbow is caused by work, take breaks frequently, if you are able. Talk with your manager about how to best perform tasks in a way that is safe.  If your tennis elbow is caused by computer use, talk with your manager about any changes that can be made to your work environment. General Instructions  If directed, apply ice to the painful area:  Put ice in a plastic bag.  Place a towel between your skin and the bag.  Leave the ice on for 20 minutes,  2-3 times per day.  Take medicines only as directed by your health care provider.  If you were given a brace, wear it as directed by your health care provider.  Keep all follow-up visits as directed by your health care provider. This is important. SEEK MEDICAL CARE IF:  Your pain does not get better with treatment.  Your pain gets worse.  You have numbness or weakness in your forearm, hand, or fingers.   This information is not intended to replace advice given to  you by your health care provider. Make sure you discuss any questions you have with your health care provider.   Document Released: 04/19/2005 Document Revised: 09/03/2014 Document Reviewed: 04/15/2014 Elsevier Interactive Patient Education [employer]2016 Elsevier Inc.

## 2015-04-29 NOTE — Progress Notes (Signed)
   Subjective:    Patient ID: Robin Ball, female    DOB: August 04, 1970, 44 y.o.   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  patient with head congestion drainage coughing symptoms over the past several days sinus pressure pain discomfort denies high fever chills  Review of Systems  see above.    Objective:   Physical Exam   on physical exam eardrums are normal throat is normal mild sinus tenderness lungs are clear hearts regular   good range of motion of the left shoulder. Moderate tenderness of the  Lateral aspect of the elbow. Consistent with lateral epicondylitis.      Assessment & Plan:   lateral epicondylitis-use strap, anti-inflammatory, cold compresses frequently, gentle stretches, if not dramatically better over the next few weeks consider injections  Sinusitis antibiotics prescribed warning signs discussed follow-up if problems

## 2015-06-13 ENCOUNTER — Encounter: Payer: Self-pay | Admitting: Family Medicine

## 2015-06-13 ENCOUNTER — Ambulatory Visit (INDEPENDENT_AMBULATORY_CARE_PROVIDER_SITE_OTHER): Payer: Managed Care, Other (non HMO) | Admitting: Family Medicine

## 2015-06-13 VITALS — BP 122/84 | Ht 62.25 in | Wt 188.0 lb

## 2015-06-13 DIAGNOSIS — I7300 Raynaud's syndrome without gangrene: Secondary | ICD-10-CM

## 2015-06-13 DIAGNOSIS — M7712 Lateral epicondylitis, left elbow: Secondary | ICD-10-CM | POA: Diagnosis not present

## 2015-06-13 NOTE — Progress Notes (Signed)
Referral to Rheumatology put into epic.

## 2015-06-13 NOTE — Progress Notes (Signed)
   Subjective:    Patient ID: Robin Ball, female    DOB: 1970-10-06, 45 y.o.   MRN: [id number]  HPI Patient is here today for elbow pain. Onset about 2-3 months ago. Treatments tried: Naproxen with no relief.   Patient also has been having loss of circulation in her fingers. Onset 6 days ago.  Patient showed me a picture sheet to where the end of her left finger went completely white. She states that it had some pain and then it went away after 1520 minutes she's never had this before.  Review of Systems Elbow pain discomfort.    Objective:   Physical Exam  Lungs clear hearts regular tenderness in the left elbow. Pain radiates from the elbow into the mid bicep region hurts with gripping Pulses in the hands are normal. With consent the area in her left elbow was numbed with 1% lidocaine then injected with 1% lidocaine mixed with 3/4 cc Depo-Medrol no complications there was no injection into the ligament or the muscle    Assessment & Plan:   tennis elbow-injection today. If ongoing troubles referral to orthopedics  [place]  phenomenon-referral to rheumatology to rule out underlying vasculitis Patient counseled to quit smoking  Supportive measures to avoid having this happen again were discussed   urology is working her up currently for hematuria. She will send [place] results of these testing

## 2015-06-20 ENCOUNTER — Encounter: Payer: Self-pay | Admitting: Family Medicine

## 2015-07-13 ENCOUNTER — Encounter: Payer: Self-pay | Admitting: Family Medicine

## 2015-07-13 DIAGNOSIS — I7300 Raynaud's syndrome without gangrene: Secondary | ICD-10-CM | POA: Insufficient documentation

## 2015-08-21 DIAGNOSIS — M7712 Lateral epicondylitis, left elbow: Secondary | ICD-10-CM | POA: Diagnosis not present

## 2015-08-21 DIAGNOSIS — B351 Tinea unguium: Secondary | ICD-10-CM | POA: Diagnosis not present

## 2015-09-04 DIAGNOSIS — R3129 Other microscopic hematuria: Secondary | ICD-10-CM | POA: Diagnosis not present

## 2015-09-23 ENCOUNTER — Other Ambulatory Visit: Payer: Self-pay | Admitting: Nurse Practitioner

## 2015-09-30 DIAGNOSIS — B351 Tinea unguium: Secondary | ICD-10-CM | POA: Diagnosis not present

## 2016-01-21 ENCOUNTER — Other Ambulatory Visit: Payer: Self-pay | Admitting: Family Medicine

## 2016-03-08 ENCOUNTER — Other Ambulatory Visit: Payer: Self-pay | Admitting: Family Medicine

## 2016-04-05 ENCOUNTER — Telehealth: Payer: Self-pay | Admitting: Nurse Practitioner

## 2016-04-05 ENCOUNTER — Other Ambulatory Visit: Payer: Self-pay | Admitting: Family Medicine

## 2016-04-05 MED ORDER — LISINOPRIL 10 MG PO TABS: 10.0000 mg | ORAL_TABLET | Freq: Every day | ORAL | 0 refills | Status: DC

## 2016-04-05 NOTE — Telephone Encounter (Signed)
Pt is needing a refill on her lisinopril (PRINIVIL,ZESTRIL) 10 MG tablet   [facility]

## 2016-04-05 NOTE — Telephone Encounter (Signed)
Spoke with patient and informed her per Dr.Scott Luking- we sent over refills to get her to her appointment on the 13th. Patient verbalized understanding.

## 2016-04-14 ENCOUNTER — Ambulatory Visit (INDEPENDENT_AMBULATORY_CARE_PROVIDER_SITE_OTHER): Payer: BLUE CROSS/BLUE SHIELD | Admitting: Nurse Practitioner

## 2016-04-14 VITALS — BP 122/79 | Ht 62.25 in | Wt 194.8 lb

## 2016-04-14 DIAGNOSIS — I10 Essential (primary) hypertension: Secondary | ICD-10-CM

## 2016-04-14 MED ORDER — LISINOPRIL 10 MG PO TABS: 10.0000 mg | ORAL_TABLET | Freq: Every day | ORAL | 1 refills | Status: DC

## 2016-04-14 MED ORDER — PHENTERMINE HCL 37.5 MG PO TABS: 37.5000 mg | ORAL_TABLET | Freq: Every day | ORAL | 2 refills | Status: DC

## 2016-04-14 MED ORDER — NORGESTREL-ETHINYL ESTRADIOL 0.3-30 MG-MCG PO TABS: ORAL_TABLET | ORAL | 3 refills | Status: DC

## 2016-04-16 ENCOUNTER — Encounter: Payer: Self-pay | Admitting: Nurse Practitioner

## 2016-04-16 NOTE — Progress Notes (Signed)
Subjective:  Presents for routine follow-up. Compliant with medications. No chest pain/ischemic type pain or shortness of breath. Overall eating healthy. Limited exercise due to very busy schedule. Also needs refills on her birth control pills.  Objective:   BP 122/79   Ht 5' 2.25" (1.581 m)   Wt 194 lb 12.8 oz (88.4 kg)   BMI 35.34 kg/m  NAD. Alert, oriented. Lungs clear. Heart regular rate rhythm. Lower extremities no edema. Slow steady weight gain over the past year approximately 10 pounds.  Assessment:  Problem List Items Addressed This Visit      Cardiovascular and Mediastinum   Essential hypertension, benign - Primary   Relevant Medications   lisinopril (PRINIVIL,ZESTRIL) 10 MG tablet    Other Visit Diagnoses    Morbid obesity (HCC)       Relevant Medications   phentermine (ADIPEX-P) 37.5 MG tablet     Plan:  Meds ordered this encounter  Medications  . lisinopril (PRINIVIL,ZESTRIL) 10 MG tablet    Sig: Take 1 tablet (10 mg total) by mouth daily.    Dispense:  90 tablet    Refill:  1    Needs office visit    Order Specific Question:   Supervising Provider    Answer:   Merlyn AlbertLUKING, WILLIAM S [[id number]]  . phentermine (ADIPEX-P) 37.5 MG tablet    Sig: Take 1 tablet (37.5 mg total) by mouth daily before breakfast.    Dispense:  30 tablet    Refill:  2    Order Specific Question:   Supervising Provider    Answer:   Merlyn AlbertLUKING, WILLIAM S [[id number]]  . norgestrel-ethinyl estradiol (CRYSELLE-28) 0.3-30 MG-MCG tablet    Sig: TAKE 1 TABLET ONCE DAILY AS DIRECTED.    Dispense:  84 tablet    Refill:  3    Order Specific Question:   Supervising Provider    Answer:   Merlyn AlbertLUKING, WILLIAM S [[id number]]   Continue lisinopril as directed. Discussed importance of regular activity and healthy diet. Patient request a trial of phentermine. Restart this as directed. DC med if any problems. Also refills on her birth control pills. Discussed risk associated with OC use particularly for her age including blood  clots and cancer risk. Patient verbalizes understanding. Nonsmoker. Return in about 3 months (around 07/13/2016) for recheck.

## 2016-07-09 ENCOUNTER — Ambulatory Visit: Payer: BLUE CROSS/BLUE SHIELD | Admitting: Nurse Practitioner

## 2016-08-11 DIAGNOSIS — Z1231 Encounter for screening mammogram for malignant neoplasm of breast: Secondary | ICD-10-CM | POA: Diagnosis not present

## 2016-08-11 DIAGNOSIS — Z6833 Body mass index (BMI) 33.0-33.9, adult: Secondary | ICD-10-CM | POA: Diagnosis not present

## 2016-08-11 DIAGNOSIS — Z01419 Encounter for gynecological examination (general) (routine) without abnormal findings: Secondary | ICD-10-CM | POA: Diagnosis not present

## 2016-11-02 ENCOUNTER — Other Ambulatory Visit: Payer: Self-pay | Admitting: Family Medicine

## 2016-12-25 ENCOUNTER — Other Ambulatory Visit: Payer: Self-pay | Admitting: Nurse Practitioner

## 2017-02-07 ENCOUNTER — Other Ambulatory Visit: Payer: Self-pay | Admitting: Family Medicine

## 2017-02-16 ENCOUNTER — Ambulatory Visit (INDEPENDENT_AMBULATORY_CARE_PROVIDER_SITE_OTHER): Payer: BLUE CROSS/BLUE SHIELD | Admitting: Nurse Practitioner

## 2017-02-16 ENCOUNTER — Encounter: Payer: Self-pay | Admitting: Nurse Practitioner

## 2017-02-16 VITALS — BP 136/74 | Ht 62.25 in | Wt 189.0 lb

## 2017-02-16 DIAGNOSIS — I10 Essential (primary) hypertension: Secondary | ICD-10-CM | POA: Diagnosis not present

## 2017-02-16 MED ORDER — LISINOPRIL 10 MG PO TABS: ORAL_TABLET | ORAL | 1 refills | Status: DC

## 2017-02-16 MED ORDER — PHENTERMINE HCL 37.5 MG PO TABS: 37.5000 mg | ORAL_TABLET | Freq: Every day | ORAL | 2 refills | Status: DC

## 2017-02-16 NOTE — Progress Notes (Signed)
Subjective:  Presents for recheck on her hypertension. Took her blood pressure pill last night. Gets preventive health physical, mammogram and lab work through [place] in [place]. Copy of labs not available during office visit. No chest pain/ischemic type pain shortness of breath or edema. No TIA symptoms. Continues to struggle with her weight. Would like to restart Phentermine; has taken without difficulty in the past. Has an active job requiring walking.   Objective:   BP 136/74   Ht 5' 2.25" (1.581 m)   Wt 189 lb (85.7 kg)   BMI 34.29 kg/m  NAD. Alert, oriented. Lungs clear. Heart RRR. Carotids no bruits or thrills. LE: no edema.   Assessment:   Problem List Items Addressed This Visit      Cardiovascular and Mediastinum   Essential hypertension, benign - Primary   Relevant Medications   lisinopril (PRINIVIL,ZESTRIL) 10 MG tablet     Other   Morbid obesity (HCC)   Relevant Medications   phentermine (ADIPEX-P) 37.5 MG tablet       Plan:   Meds ordered this encounter  Medications  . phentermine (ADIPEX-P) 37.5 MG tablet    Sig: Take 1 tablet (37.5 mg total) by mouth daily before breakfast.    Dispense:  30 tablet    Refill:  2    Order Specific Question:   Supervising Provider    Answer:   Merlyn AlbertLUKING, WILLIAM S [[id number]]  . lisinopril (PRINIVIL,ZESTRIL) 10 MG tablet    Sig: TAKE (1) TABLET BY MOUTH ONCE DAILY.    Dispense:  90 tablet    Refill:  1    Order Specific Question:   Supervising Provider    Answer:   Merlyn AlbertLUKING, WILLIAM S [[id number]]   Recommend healthy diet or weight loss program and regular activity.  Return in about 6 months (around 08/17/2017) for BP check up. Recheck in 3 months if she wishes to continue weight loss medication.

## 2017-02-23 ENCOUNTER — Telehealth: Payer: Self-pay | Admitting: Family Medicine

## 2017-02-23 NOTE — Telephone Encounter (Signed)
Received prior authorization response for patient's Phentermine stating that medication was denied because patient can only take for 3 months within 1 year.

## 2017-02-23 NOTE — Telephone Encounter (Signed)
Patient notified and stated she would pay cash price for medication.

## 2017-02-23 NOTE — Telephone Encounter (Signed)
Please let her know. We have no control over insurance protocol.

## 2017-03-21 ENCOUNTER — Other Ambulatory Visit: Payer: Self-pay | Admitting: *Deleted

## 2017-03-21 MED ORDER — LISINOPRIL 10 MG PO TABS: ORAL_TABLET | ORAL | 1 refills | Status: DC

## 2017-08-12 DIAGNOSIS — Z01419 Encounter for gynecological examination (general) (routine) without abnormal findings: Secondary | ICD-10-CM | POA: Diagnosis not present

## 2017-08-12 DIAGNOSIS — Z6833 Body mass index (BMI) 33.0-33.9, adult: Secondary | ICD-10-CM | POA: Diagnosis not present

## 2017-08-12 DIAGNOSIS — Z1231 Encounter for screening mammogram for malignant neoplasm of breast: Secondary | ICD-10-CM | POA: Diagnosis not present

## 2017-08-12 DIAGNOSIS — N912 Amenorrhea, unspecified: Secondary | ICD-10-CM | POA: Diagnosis not present

## 2017-08-22 ENCOUNTER — Other Ambulatory Visit: Payer: Self-pay | Admitting: Family Medicine

## 2017-10-04 ENCOUNTER — Other Ambulatory Visit: Payer: Self-pay | Admitting: Nurse Practitioner

## 2017-11-24 ENCOUNTER — Encounter: Payer: Self-pay | Admitting: Nurse Practitioner

## 2017-11-24 ENCOUNTER — Ambulatory Visit (INDEPENDENT_AMBULATORY_CARE_PROVIDER_SITE_OTHER): Payer: BLUE CROSS/BLUE SHIELD | Admitting: Nurse Practitioner

## 2017-11-24 VITALS — BP 130/88 | Ht 62.5 in | Wt 184.0 lb

## 2017-11-24 DIAGNOSIS — I10 Essential (primary) hypertension: Secondary | ICD-10-CM | POA: Diagnosis not present

## 2017-11-24 MED ORDER — LISINOPRIL 10 MG PO TABS: ORAL_TABLET | ORAL | 1 refills | Status: DC

## 2017-11-25 ENCOUNTER — Telehealth: Payer: Self-pay | Admitting: Nurse Practitioner

## 2017-11-25 ENCOUNTER — Encounter: Payer: Self-pay | Admitting: Nurse Practitioner

## 2017-11-25 NOTE — Progress Notes (Signed)
Subjective: Presents for recheck on her hypertension.  Adherent to medication regimen.  Limited activity due to her work schedule.  Does well with her diet.  Is working on her weight loss. Denies CP/ischemic type pain or SOB. No visual changes. No difficulty speaking or swallowing. No numbness or weakness of the face, arms or legs.    Objective:   BP 130/88   Ht 5' 2.5" (1.588 m)   Wt 184 lb (83.5 kg)   BMI 33.12 kg/m  NAD.  Alert, oriented.  Lungs clear.  Heart regular rate rhythm.  Carotids no bruits or thrills.  Lower extremities no edema.   Assessment:   Problem List Items Addressed This Visit      Cardiovascular and Mediastinum   Essential hypertension, benign - Primary   Relevant Medications   lisinopril (PRINIVIL,ZESTRIL) 10 MG tablet       Plan:   Meds ordered this encounter  Medications  . lisinopril (PRINIVIL,ZESTRIL) 10 MG tablet    Sig: TAKE 1 TABLET BY MOUTH EVERY DAY    Dispense:  90 tablet    Refill:  1    Order Specific Question:   Supervising Provider    Answer:   Merlyn AlbertLUKING, WILLIAM S [[id number]]   Continue lisinopril as directed.  After the visit it was noted that patient has not had her lab work done through our office.  We will have the nurse contact her regarding this. Return in about 6 months (around 05/27/2018) for BP recheck.

## 2017-11-25 NOTE — Telephone Encounter (Signed)
See requested lab results in yellow folder on desk.

## 2017-11-25 NOTE — Progress Notes (Signed)
Patient stated she had labs thru her GYN and will call and have a copy sent to our office

## 2017-11-25 NOTE — Progress Notes (Signed)
Noted  

## 2017-11-28 ENCOUNTER — Other Ambulatory Visit: Payer: Self-pay | Admitting: Nurse Practitioner

## 2017-11-28 ENCOUNTER — Other Ambulatory Visit: Payer: Self-pay

## 2017-11-28 DIAGNOSIS — Z13220 Encounter for screening for lipoid disorders: Secondary | ICD-10-CM

## 2017-11-28 DIAGNOSIS — R5383 Other fatigue: Secondary | ICD-10-CM

## 2017-11-28 DIAGNOSIS — Z1321 Encounter for screening for nutritional disorder: Secondary | ICD-10-CM

## 2017-11-28 NOTE — Telephone Encounter (Signed)
Labs placed in Epic. Pt aware.

## 2017-11-28 NOTE — Telephone Encounter (Signed)
Lipid, CMP and vitamin D. Thanks.

## 2017-11-28 NOTE — Telephone Encounter (Signed)
Pt agreed to do other routine labs. What labs would you like me to order?

## 2017-11-28 NOTE — Telephone Encounter (Signed)
The labs done by GYN only covered female hormones and thyroid. Consider other routine labs.

## 2017-12-08 DIAGNOSIS — Z1321 Encounter for screening for nutritional disorder: Secondary | ICD-10-CM | POA: Diagnosis not present

## 2017-12-08 DIAGNOSIS — R5383 Other fatigue: Secondary | ICD-10-CM | POA: Diagnosis not present

## 2017-12-08 DIAGNOSIS — Z13220 Encounter for screening for lipoid disorders: Secondary | ICD-10-CM | POA: Diagnosis not present

## 2017-12-09 LAB — COMPREHENSIVE METABOLIC PANEL
A/G RATIO: 1.9 (ref 1.2–2.2)
ALT: 19 IU/L (ref 0–32)
AST: 15 IU/L (ref 0–40)
Albumin: 4.3 g/dL (ref 3.5–5.5)
Alkaline Phosphatase: 67 IU/L (ref 39–117)
BUN/Creatinine Ratio: 12 (ref 9–23)
BUN: 8 mg/dL (ref 6–24)
Bilirubin Total: 0.3 mg/dL (ref 0.0–1.2)
CALCIUM: 9.2 mg/dL (ref 8.7–10.2)
CO2: 22 mmol/L (ref 20–29)
CREATININE: 0.68 mg/dL (ref 0.57–1.00)
Chloride: 104 mmol/L (ref 96–106)
GFR, EST AFRICAN AMERICAN: 121 mL/min/{1.73_m2} (ref >59)
GFR, EST NON AFRICAN AMERICAN: 105 mL/min/{1.73_m2} (ref >59)
Globulin, Total: 2.3 g/dL (ref 1.5–4.5)
Glucose: 90 mg/dL (ref 65–99)
POTASSIUM: 4.4 mmol/L (ref 3.5–5.2)
Sodium: 140 mmol/L (ref 134–144)
TOTAL PROTEIN: 6.6 g/dL (ref 6.0–8.5)

## 2017-12-09 LAB — LIPID PANEL
Chol/HDL Ratio: 3.1 ratio (ref 0.0–4.4)
Cholesterol, Total: 216 mg/dL — ABNORMAL HIGH (ref 100–199)
HDL: 69 mg/dL (ref >39)
LDL Calculated: 132 mg/dL — ABNORMAL HIGH (ref 0–99)
TRIGLYCERIDES: 75 mg/dL (ref 0–149)
VLDL Cholesterol Cal: 15 mg/dL (ref 5–40)

## 2017-12-09 LAB — VITAMIN D 25 HYDROXY (VIT D DEFICIENCY, FRACTURES): Vit D, 25-Hydroxy: 32.5 ng/mL (ref 30.0–100.0)

## 2017-12-11 ENCOUNTER — Encounter: Payer: Self-pay | Admitting: Family Medicine

## 2018-01-31 DIAGNOSIS — Z23 Encounter for immunization: Secondary | ICD-10-CM | POA: Diagnosis not present

## 2018-06-12 ENCOUNTER — Other Ambulatory Visit: Payer: Self-pay | Admitting: *Deleted

## 2018-06-12 MED ORDER — LISINOPRIL 10 MG PO TABS: ORAL_TABLET | ORAL | 0 refills | Status: DC

## 2018-06-12 NOTE — Telephone Encounter (Signed)
May have 90 day Will need follow up this spring

## 2018-06-14 ENCOUNTER — Other Ambulatory Visit: Payer: Self-pay | Admitting: Family Medicine

## 2018-06-14 ENCOUNTER — Telehealth: Payer: Self-pay | Admitting: Family Medicine

## 2018-06-14 MED ORDER — OSELTAMIVIR PHOSPHATE 75 MG PO CAPS: ORAL_CAPSULE | ORAL | 0 refills | Status: DC

## 2018-06-14 NOTE — Telephone Encounter (Signed)
It would be fine to print a prescription for Tamiflu 75 mg 1 twice daily for 5 days I do not recommend starting Tamiflu unless having true flulike symptoms of fever cough sore throat body aches headache runny nose without onset  Certainly if anything seems unusual such as shortness of breath or severe illness it is very important to be seen 

## 2018-06-14 NOTE — Telephone Encounter (Signed)
Requesting a medication filled of Tamiflu. A person in there household is current patient of ours also.  ° °Pharmacy:  [id number] - [place], [place] - [address] AT [address] °

## 2018-06-14 NOTE — Telephone Encounter (Signed)
Script printed awaiting signature. Pt is aware to pick up scripts at front. Pt verbalized understanding.

## 2018-06-14 NOTE — Telephone Encounter (Signed)
Please advise. Thank you

## 2018-07-17 ENCOUNTER — Other Ambulatory Visit: Payer: Self-pay | Admitting: Family Medicine

## 2018-07-17 NOTE — Telephone Encounter (Signed)
Sorry no go

## 2018-08-16 DIAGNOSIS — Z1231 Encounter for screening mammogram for malignant neoplasm of breast: Secondary | ICD-10-CM | POA: Diagnosis not present

## 2018-08-16 DIAGNOSIS — Z1321 Encounter for screening for nutritional disorder: Secondary | ICD-10-CM | POA: Diagnosis not present

## 2018-08-16 DIAGNOSIS — Z01419 Encounter for gynecological examination (general) (routine) without abnormal findings: Secondary | ICD-10-CM | POA: Diagnosis not present

## 2018-08-16 DIAGNOSIS — N951 Menopausal and female climacteric states: Secondary | ICD-10-CM | POA: Diagnosis not present

## 2018-08-16 DIAGNOSIS — Z6834 Body mass index (BMI) 34.0-34.9, adult: Secondary | ICD-10-CM | POA: Diagnosis not present

## 2018-09-05 ENCOUNTER — Other Ambulatory Visit: Payer: Self-pay | Admitting: Family Medicine

## 2018-09-05 NOTE — Telephone Encounter (Signed)
May have this refill needs to schedule follow-up visit

## 2018-09-21 ENCOUNTER — Ambulatory Visit: Payer: BLUE CROSS/BLUE SHIELD | Admitting: Family Medicine

## 2018-09-27 ENCOUNTER — Ambulatory Visit (INDEPENDENT_AMBULATORY_CARE_PROVIDER_SITE_OTHER): Payer: BLUE CROSS/BLUE SHIELD | Admitting: Family Medicine

## 2018-09-27 ENCOUNTER — Other Ambulatory Visit: Payer: Self-pay

## 2018-09-27 DIAGNOSIS — E669 Obesity, unspecified: Secondary | ICD-10-CM

## 2018-09-27 DIAGNOSIS — Z6831 Body mass index (BMI) 31.0-31.9, adult: Secondary | ICD-10-CM

## 2018-09-27 DIAGNOSIS — I10 Essential (primary) hypertension: Secondary | ICD-10-CM

## 2018-09-27 MED ORDER — LISINOPRIL 10 MG PO TABS: 10.0000 mg | ORAL_TABLET | Freq: Every day | ORAL | 1 refills | Status: DC

## 2018-09-27 MED ORDER — PHENTERMINE HCL 37.5 MG PO TABS: 37.5000 mg | ORAL_TABLET | Freq: Every day | ORAL | 2 refills | Status: DC

## 2018-09-27 NOTE — Progress Notes (Signed)
   Subjective:    Patient ID: Robin Ball, female    DOB: Dec 02, 1970, 48 y.o.   MRN: [id number]  HPI Pt states she is doing well on BP medication. Pt states she would like to get back on Phentermine if possible. Pt had yearly GYN check up and everything came back fine as far as mammo and pap. Pt did states that she is in menopause and her GYN placed her on Estradiol and Progesterone.   Patient for blood pressure check up.  The patient does have hypertension.  The patient is on medication.  Patient relates compliance with meds. Todays BP reviewed with the patient. Patient denies issues with medication. Patient relates reasonable diet. Patient tries to minimize salt. Patient aware of BP goals.  Virtual Visit via Video Note  I connected with Robin Ball on 09/27/18 at  1:40 PM EDT by a video enabled telemedicine application and verified that I am speaking with the correct person using two identifiers.  Location: Patient: home Provider: office   I discussed the limitations of evaluation and management by telemedicine and the availability of in person appointments. The patient expressed understanding and agreed to proceed.  History of Present Illness:    Observations/Objective:   Assessment and Plan:   Follow Up Instructions:    I discussed the assessment and treatment plan with the patient. The patient was provided an opportunity to ask questions and all were answered. The patient agreed with the plan and demonstrated an understanding of the instructions.   The patient was advised to call back or seek an in-person evaluation if the symptoms worsen or if the condition fails to improve as anticipated.  I provided 15 minutes of non-face-to-face time during this encounter.   Marlowe [place], LPN    Review of Systems  Constitutional: Negative for activity change, fatigue and fever.  HENT: Negative for congestion and rhinorrhea.   Respiratory: Negative for cough, chest  tightness and shortness of breath.   Cardiovascular: Negative for chest pain and leg swelling.  Gastrointestinal: Negative for abdominal pain and nausea.  Skin: Negative for color change.  Neurological: Negative for dizziness and headaches.  Psychiatric/Behavioral: Negative for agitation and behavioral problems.       Objective:   Physical Exam  Patient had virtual visit Appears to be in no distress Atraumatic Neuro able to relate and oriented No apparent resp distress Color normal       Assessment & Plan:  Blood pressure good control watching diet try to stay active continue medication patient will get lab work from her gynecologist sent here  Significant obesity we discussed that healthy diet regular physical activity exercise she will utilize phenteramine over the course of the next 3 months and follow-up sooner problems she is use this before

## 2019-03-08 ENCOUNTER — Other Ambulatory Visit: Payer: Self-pay

## 2019-03-09 ENCOUNTER — Other Ambulatory Visit: Payer: Self-pay

## 2019-03-10 ENCOUNTER — Other Ambulatory Visit: Payer: BC Managed Care – PPO

## 2019-04-02 ENCOUNTER — Other Ambulatory Visit: Payer: Self-pay | Admitting: Family Medicine

## 2019-07-20 DIAGNOSIS — N959 Unspecified menopausal and perimenopausal disorder: Secondary | ICD-10-CM | POA: Diagnosis not present

## 2019-07-20 DIAGNOSIS — N644 Mastodynia: Secondary | ICD-10-CM | POA: Diagnosis not present

## 2019-08-21 ENCOUNTER — Telehealth: Payer: Self-pay | Admitting: Family Medicine

## 2019-08-21 NOTE — Telephone Encounter (Signed)
Please advise. Thank you

## 2019-08-21 NOTE — Telephone Encounter (Signed)
Recommend office visit or video virtual (only if relatively certain video capability will work)

## 2019-08-21 NOTE — Telephone Encounter (Signed)
Pt contacted and verbalized understanding. Pt transferred up front to set up in office appt.

## 2019-08-21 NOTE — Telephone Encounter (Signed)
Removed tick on Saturday, not sure how long it was attached Pt states spot is red, puffy, itchy, & a little rash  (might still have the tick in a bag)  NO fever, NOT warm, NO fatigue, NO body aches Looked online & saw possibility of lyme disease  ? NTBS? - Please advise & call pt

## 2019-08-22 ENCOUNTER — Encounter: Payer: Self-pay | Admitting: Family Medicine

## 2019-08-22 ENCOUNTER — Other Ambulatory Visit: Payer: Self-pay

## 2019-08-22 ENCOUNTER — Ambulatory Visit (INDEPENDENT_AMBULATORY_CARE_PROVIDER_SITE_OTHER): Payer: BC Managed Care – PPO | Admitting: Family Medicine

## 2019-08-22 VITALS — BP 118/78 | Temp 98.3°F | Ht 62.25 in | Wt 181.4 lb

## 2019-08-22 DIAGNOSIS — S70362A Insect bite (nonvenomous), left thigh, initial encounter: Secondary | ICD-10-CM | POA: Diagnosis not present

## 2019-08-22 DIAGNOSIS — W57XXXA Bitten or stung by nonvenomous insect and other nonvenomous arthropods, initial encounter: Secondary | ICD-10-CM

## 2019-08-22 DIAGNOSIS — I10 Essential (primary) hypertension: Secondary | ICD-10-CM | POA: Diagnosis not present

## 2019-08-22 MED ORDER — DOXYCYCLINE HYCLATE 100 MG PO CAPS: 100.0000 mg | ORAL_CAPSULE | Freq: Two times a day (BID) | ORAL | 0 refills | Status: DC

## 2019-08-22 MED ORDER — LISINOPRIL 10 MG PO TABS: 10.0000 mg | ORAL_TABLET | Freq: Every day | ORAL | 1 refills | Status: DC

## 2019-08-22 NOTE — Progress Notes (Signed)
   Subjective:    Patient ID: Robin Ball, female    DOB: [birth year], 49 y.o.   MRN: [id number]  HPI  Patient arrives after removing tick from left thigh on Saturday. Patient states it is a little red circle where the tick was removed. Patient with upper left thigh denies fever chills sweats states had some localized redness no other particular troubles no allergy symptoms  Review of Systems Please see above    Objective:   Physical Exam  Rest of skin is normal left upper thigh erythematous area approximately half inch in diameter no ecchymosis this does not blanch  Time spent with patient discussing tick bites to avoid them and what to watch for    Assessment & Plan:  Tick bite associated redness left leg Doxycycline twice daily 7 days Warning signs were discussed in detail Follow-up if ongoing trouble.  Elevated blood pressure history of hypertension BP under good control currently refills given labs and follow-up within 6 months

## 2019-08-22 NOTE — Patient Instructions (Signed)

## 2019-10-23 DIAGNOSIS — Z1231 Encounter for screening mammogram for malignant neoplasm of breast: Secondary | ICD-10-CM | POA: Diagnosis not present

## 2019-10-23 DIAGNOSIS — Z6833 Body mass index (BMI) 33.0-33.9, adult: Secondary | ICD-10-CM | POA: Diagnosis not present

## 2019-10-23 DIAGNOSIS — Z01419 Encounter for gynecological examination (general) (routine) without abnormal findings: Secondary | ICD-10-CM | POA: Diagnosis not present

## 2020-02-14 DIAGNOSIS — Z23 Encounter for immunization: Secondary | ICD-10-CM | POA: Diagnosis not present

## 2020-11-01 ENCOUNTER — Telehealth: Payer: Self-pay | Admitting: Family Medicine

## 2020-11-04 NOTE — Telephone Encounter (Signed)
Sent mychart message

## 2021-04-02 ENCOUNTER — Other Ambulatory Visit: Payer: Self-pay | Admitting: Family Medicine

## 2021-04-17 ENCOUNTER — Other Ambulatory Visit: Payer: Self-pay | Admitting: Family Medicine

## 2021-04-20 NOTE — Telephone Encounter (Signed)
Sent message 04/20/21

## 2021-04-23 NOTE — Telephone Encounter (Signed)
Sent second request for appointment 04/23/21

## 2021-05-07 ENCOUNTER — Other Ambulatory Visit: Payer: Self-pay | Admitting: *Deleted

## 2021-05-07 MED ORDER — LISINOPRIL 10 MG PO TABS: 10.0000 mg | ORAL_TABLET | Freq: Every day | ORAL | 0 refills | Status: DC

## 2021-05-07 NOTE — Telephone Encounter (Signed)
Sent request twice no response °

## 2021-08-22 ENCOUNTER — Other Ambulatory Visit: Payer: Self-pay | Admitting: Family Medicine

## 2021-08-24 NOTE — Telephone Encounter (Signed)
Sent my chart to schedule appointment 08/24/2021 ?

## 2021-08-27 NOTE — Telephone Encounter (Signed)
Patient has appointment on 5/12 with Eber Jones but needs refill only has five pills left. ?

## 2021-09-11 ENCOUNTER — Encounter: Payer: Self-pay | Admitting: Nurse Practitioner

## 2021-09-11 ENCOUNTER — Ambulatory Visit (INDEPENDENT_AMBULATORY_CARE_PROVIDER_SITE_OTHER): Payer: Commercial Managed Care - PPO | Admitting: Nurse Practitioner

## 2021-09-11 VITALS — BP 120/83 | Ht 63.0 in | Wt 185.8 lb

## 2021-09-11 DIAGNOSIS — I10 Essential (primary) hypertension: Secondary | ICD-10-CM | POA: Diagnosis not present

## 2021-09-11 DIAGNOSIS — R002 Palpitations: Secondary | ICD-10-CM | POA: Diagnosis not present

## 2021-09-11 MED ORDER — SCOPOLAMINE 1 MG/3DAYS TD PT72: 1.0000 | MEDICATED_PATCH | TRANSDERMAL | 0 refills | Status: DC

## 2021-09-11 NOTE — Progress Notes (Signed)
? ?  Subjective:  ? ? Patient ID: Robin Ball, female    DOB: 12/08/1970, 51 y.o.   MRN: [id number] ? ?Hypertension ?This is a chronic problem. The current episode started more than 1 year ago. Associated symptoms include palpitations. Pertinent negatives include no chest pain or shortness of breath. Treatments tried: lisinopril. There are no compliance problems.   ? ?Patient needs patches for upcoming cruise.  ?Presents for recheck on hypertension.  Adherent to taking lisinopril every day.  States she had frequent palpitations and was seen by a cardiologist with [facility].  Wore a Holter monitor and had a cardiac echo all of which was normal.  No further work-up was needed.  Smokes tobacco on a rare basis mainly socially a few times a week.  Gets regular physicals, mammograms and routine labs through her gynecologist, those records are not available during office visit. ?States she had lost about 30 pounds doing a specific program with protein drinks.  Is getting plenty of activity.  Wants fall/winter started patient has not been as active and is not following the program and is gaining weight back.  Has taken phentermine in the past with limited improvement. ? ?  09/11/2021  ?  9:13 AM  ?Depression screen PHQ 2/9  ?Decreased Interest 0  ?Down, Depressed, Hopeless 0  ?PHQ - 2 Score 0  ? ? ? ? ?Review of Systems  ?Constitutional:  Positive for appetite change.  ?Respiratory:  Negative for cough, chest tightness, shortness of breath and wheezing.   ?Cardiovascular:  Positive for palpitations. Negative for chest pain.  ? ?   ?Objective:  ? Physical Exam ?NAD.  Alert, oriented.  Thyroid nontender to palpation, no mass or goiter noted.  Lungs clear.  Heart regular rate rhythm.  No tachycardia noted. ?Today's Vitals  ? 09/11/21 0905  ?BP: 120/83  ?Weight: 185 lb 12.8 oz (84.3 kg)  ?Height: 5\' 3"  (1.6 m)  ? ? ? ?   ?Assessment & Plan:  ? ?Problem List Items Addressed This Visit   ? ?  ? Cardiovascular and Mediastinum   ? Essential hypertension, benign - Primary  ?  ? Other  ? Palpitations  ? ?Meds ordered this encounter  ?Medications  ? scopolamine (TRANSDERM-SCOP) 1 MG/3DAYS  ?  Sig: Place 1 patch (1.5 mg total) onto the skin every 3 (three) days.  ?  Dispense:  4 patch  ?  Refill:  0  ?  Order Specific Question:   Supervising Provider  ?  Answer:   Sallee Lange A [[id number]]  ? ?Continue lisinopril as directed. ?Encouraged regular activity. ?Encourage patient to look at weight loss plans that can be done long-term.  Question whether some of this is due to some seasonal affective disorder. ?Since patient has had a work-up for palpitations with cardiology, no further follow-up needed at this time.  Recommend that she avoid excessive caffeine intake and to watch her stress level. ?Patient had a colonoscopy scheduled but had to cancel due to some family issues.  Recommend that she consider rescheduling this.  Also recommend Tdap at local pharmacy.  Defers COVID-vaccine.  Recommend hepatitis C screening with her next lab work. ?Return in about 6 months (around 03/14/2022). ? ? ? ?

## 2021-10-05 ENCOUNTER — Telehealth: Payer: Self-pay | Admitting: Family Medicine

## 2021-10-05 NOTE — Telephone Encounter (Signed)
Patient was recently seen On blood pressure medicine Very important for her to do a up-to-date metabolic 7 because this medication can affect her kidneys and potassium No further refills until she does her lab work Metabolic 7 please

## 2021-10-06 ENCOUNTER — Telehealth: Payer: Self-pay

## 2021-10-06 NOTE — Telephone Encounter (Signed)
Pt wanted to let lab work ins being sent over

## 2021-10-06 NOTE — Telephone Encounter (Signed)
So if she had blood work done somewhere else and she can provide [place] the results of the kidney function that would suffice  Otherwise we will need to order labs

## 2021-10-06 NOTE — Telephone Encounter (Signed)
See other message- patient states she is having her labs sent over

## 2021-10-06 NOTE — Telephone Encounter (Signed)
Patient states that she had lab work done 2 months ago.  She thinks this lab was obtained with the labs that were done. She is going to check and call back. Please advise. Thank you

## 2021-11-17 LAB — HM MAMMOGRAPHY

## 2021-11-27 ENCOUNTER — Other Ambulatory Visit: Payer: Self-pay | Admitting: Nurse Practitioner

## 2022-02-03 ENCOUNTER — Encounter: Payer: Self-pay | Admitting: Family Medicine

## 2022-02-17 ENCOUNTER — Encounter: Payer: Self-pay | Admitting: *Deleted

## 2022-05-15 ENCOUNTER — Other Ambulatory Visit: Payer: Self-pay | Admitting: Nurse Practitioner

## 2022-05-21 ENCOUNTER — Ambulatory Visit (INDEPENDENT_AMBULATORY_CARE_PROVIDER_SITE_OTHER): Payer: Commercial Managed Care - PPO | Admitting: Nurse Practitioner

## 2022-05-21 ENCOUNTER — Encounter: Payer: Self-pay | Admitting: Nurse Practitioner

## 2022-05-21 VITALS — BP 90/61 | HR 91 | Temp 97.6°F | Ht 63.0 in | Wt 176.8 lb

## 2022-05-21 DIAGNOSIS — I10 Essential (primary) hypertension: Secondary | ICD-10-CM | POA: Diagnosis not present

## 2022-05-21 DIAGNOSIS — F419 Anxiety disorder, unspecified: Secondary | ICD-10-CM | POA: Diagnosis not present

## 2022-05-21 NOTE — Progress Notes (Signed)
Subjective:    Patient ID: Robin Ball, female    DOB: 1971-01-28, 52 y.o.   MRN: [id number]  HPI  Patient arrives today wanting to discuss weight loss management. She lost her job this past week, which has caused her stress but admits her stress level in general is better. Financially stable. Looking at jobs. She has also been dealing with her children leaving home, which has added to her stress and anxiety. She was prescribed Lexapro by her gynecologist, but has not started taking the medication due to fear of side effects. She does not participate in any exercise outside of working. Does not follow any particular diet, and eats what she wants. She has not been sleeping well, due to feelings of "palpitations" and pain in her left chest going into the left shoulder and occasionally in the upper left arm. She has had these symptoms since March of 2023, and has a workup with follow up with a cardiologist. Normal findings according to patient. She has not tried anything to alleviate the symptoms.  No history of injury to the chest or shoulder.  Patient states at times she has been feeling dizzy at times.      05/21/2022    9:23 AM  Depression screen PHQ 2/9  Decreased Interest 1  Down, Depressed, Hopeless 0  PHQ - 2 Score 1  Altered sleeping 1  Tired, decreased energy 1  Change in appetite 1  Feeling bad or failure about yourself  0  Trouble concentrating 0  Moving slowly or fidgety/restless 0  Suicidal thoughts 0  PHQ-9 Score 4  Difficult doing work/chores Somewhat difficult      05/21/2022    9:24 AM  GAD 7 : Generalized Anxiety Score  Nervous, Anxious, on Edge 1  Control/stop worrying 1  Worry too much - different things 1  Trouble relaxing 1  Restless 1  Easily annoyed or irritable 1  Afraid - awful might happen 0  Total GAD 7 Score 6  Anxiety Difficulty Somewhat difficult      Review of Systems  Constitutional:  Positive for fatigue. Negative for activity change,  appetite change, chills and unexpected weight change.  Respiratory:  Negative for cough, chest tightness, shortness of breath and wheezing.   Cardiovascular:  Positive for chest pain and palpitations. Negative for leg swelling.  Musculoskeletal:        Left arm muscle pain with ROM  Psychiatric/Behavioral:  Positive for sleep disturbance. Negative for suicidal ideas. The patient is nervous/anxious.        Objective:   Physical Exam Constitutional:      Appearance: Normal appearance. She is not ill-appearing. NAD. Cardiovascular:     Rate and Rhythm: Normal rate and regular rhythm.     Pulses: Normal pulses.     Heart sounds: Normal heart sounds.  Pulmonary:     Effort: Pulmonary effort is normal. No respiratory distress.     Breath sounds: Normal breath sounds.  Skin:    General: Skin is warm and dry.  Musculoskeletal: Normal active and passive ROM of the neck and bilateral shoulders. Tenderness along the left trapezius muscle and lateral neck. Muscle tenderness along left anterior shoulder joint line. No tenderness with palpation of left upper anterior chest wall. Hand strength 5+ bilat.   Neurological:     Mental Status: She is alert. Mental status is at baseline.  Psychiatric:        Attention and Perception: Attention and perception normal. Pt is dressed  appropriately for the weather. Pt makes good eye contact.        Mood and Affect: Mood is anxious.   Today's Vitals   05/21/22 0917  BP: 90/61  Pulse: 91  Temp: 97.6 F (36.4 C)  TempSrc: Oral  SpO2: 97%  Weight: 176 lb 12.8 oz (80.2 kg)  Height: 5\' 3"  (1.6 m)   Body mass index is 31.32 kg/m. Orthostatic Vitals for the past 48 hrs (Last 6 readings):  Patient Position Orthostatic BP  05/21/22 1258 Sitting 102/70  05/21/22 1259 Supine 92/70  05/21/22 1300 Standing 94/78          Assessment & Plan:   Problem List Items Addressed This Visit       Cardiovascular and Mediastinum   Essential hypertension, benign  - Primary     Other   Anxiety   Relevant Medications   escitalopram (LEXAPRO) 10 MG tablet    Pt educated on importance of diet and exercise.  Pt encouraged to start use of Lexapro 5-10 mg in the morning that she has from previous provider. Discussed potential adverse effects. DC medication and contact office if any problems.  BP is lower than her baseline. Has lost 9 lbs since May and stress from work is gone. Pt instructed to stop blood pressure medication and monitor BP outside of office. Call back if any severe dizziness or syncopal episodes. Will recheck at next visit.  Return in about 1 month (around 06/21/2022).

## 2022-05-21 NOTE — Progress Notes (Signed)
   Subjective:    Patient ID: Robin Ball, female    DOB: [birth year], 52 y.o.   MRN: [id number]  HPI  Patient arrives today wanting to discuss weight loss management.   Patient states at times she has been feeling dizzy.     Review of Systems     Objective:   Physical Exam        Assessment & Plan:

## 2022-06-18 ENCOUNTER — Ambulatory Visit: Payer: Commercial Managed Care - PPO | Admitting: Nurse Practitioner

## 2022-07-02 ENCOUNTER — Ambulatory Visit (INDEPENDENT_AMBULATORY_CARE_PROVIDER_SITE_OTHER): Payer: Commercial Managed Care - PPO | Admitting: Nurse Practitioner

## 2022-07-02 ENCOUNTER — Telehealth: Payer: Self-pay | Admitting: *Deleted

## 2022-07-02 ENCOUNTER — Other Ambulatory Visit: Payer: Self-pay | Admitting: Nurse Practitioner

## 2022-07-02 ENCOUNTER — Telehealth: Payer: Self-pay | Admitting: Nurse Practitioner

## 2022-07-02 ENCOUNTER — Encounter: Payer: Self-pay | Admitting: Nurse Practitioner

## 2022-07-02 DIAGNOSIS — F419 Anxiety disorder, unspecified: Secondary | ICD-10-CM | POA: Diagnosis not present

## 2022-07-02 DIAGNOSIS — Z1211 Encounter for screening for malignant neoplasm of colon: Secondary | ICD-10-CM

## 2022-07-02 MED ORDER — ZEPBOUND 2.5 MG/0.5ML ~~LOC~~ SOAJ: 2.5000 mg | SUBCUTANEOUS | 0 refills | Status: AC

## 2022-07-02 MED ORDER — ESCITALOPRAM OXALATE 10 MG PO TABS: ORAL_TABLET | ORAL | 0 refills | Status: AC

## 2022-07-02 NOTE — Progress Notes (Signed)
   Subjective:    Patient ID: Robin Ball, female    DOB: Oct 18, 1970, 52 y.o.   MRN: [id number]  HPI Patient follow up for blood pressure. Has stopped the BP medication as advised.  Currently on Lexapro 10 mg half tab p.o. daily.  Denies any adverse effects.  Has seen slight improvement in her anxiety.  Just completed a job interview.  Plans to start a new position in the near future. Patient interested in trying a GLP-1 for weight loss.  Denies any personal history of pancreatitis or gallbladder issues.  No family history of thyroid cancer pancreatic cancer or other endocrine cancers. Gets regular preventive health physicals and lab work through gynecology.  No lab work available during office visit. Patient also wants to get her screening colonoscopy scheduled with local gastroenterologist. Trying to do better with her diet.  Has started drinking more water.  States she is trying to lose weight but having difficulty.    Review of Systems  Respiratory:  Negative for cough, chest tightness and shortness of breath.   Cardiovascular:  Negative for chest pain, palpitations and leg swelling.      07/02/2022   10:41 AM 05/21/2022    9:23 AM 09/11/2021    9:13 AM  Depression screen PHQ 2/9  Decreased Interest 0 1 0  Down, Depressed, Hopeless 0 0 0  PHQ - 2 Score 0 1 0  Altered sleeping 1 1   Tired, decreased energy 1 1   Change in appetite 0 1   Feeling bad or failure about yourself  0 0   Trouble concentrating 0 0   Moving slowly or fidgety/restless 0 0   Suicidal thoughts 0 0   PHQ-9 Score 2 4   Difficult doing work/chores Not difficult at all Somewhat difficult       07/02/2022   10:41 AM 05/21/2022    9:24 AM  GAD 7 : Generalized Anxiety Score  Nervous, Anxious, on Edge 1 1  Control/stop worrying 0 1  Worry too much - different things 1 1  Trouble relaxing 0 1  Restless 0 1  Easily annoyed or irritable 0 1  Afraid - awful might happen 0 0  Total GAD 7 Score 2 6  Anxiety  Difficulty Not difficult at all Somewhat difficult         Objective:   Physical Exam NAD.  Alert, oriented.  Calm cheerful affect.  Thyroid nontender to palpation, no mass or goiter noted.  Lungs clear.  Heart regular rate rhythm. Today's Vitals   07/02/22 1037  BP: 124/82  Weight: 178 lb 12.8 oz (81.1 kg)   Body mass index is 31.67 kg/m.      Assessment & Plan:  1. Morbid obesity (Scissors) Trial of low dose Zepbound. Discussed potential adverse effects. DC medication and contact office if any problems.  - tirzepatide (ZEPBOUND) 2.5 MG/0.5ML Pen; Inject 2.5 mg into the skin once a week.  Dispense: 2 mL; Refill: 0  2. Screen for colon cancer  - Ambulatory referral to Gastroenterology  3. Anxiety Increase Lexapro to 10 mg daily. Go back to 5 mg dose and contact office if any problems.  - escitalopram (LEXAPRO) 10 MG tablet; Take one tab po daily  Dispense: 90 tablet; Refill: 0 Return in about 1 month (around 08/02/2022). If she starts GLP-1

## 2022-07-02 NOTE — Telephone Encounter (Signed)
Patient called back to let you know her insurance does not cover the Zepbound

## 2022-07-02 NOTE — Patient Instructions (Addendum)
Saxenda Wegovy Zepbound**  Mix warm water equal parts with hydrogen peroxide; put in your affected ear; place a cotton ball for 10-20 minutes then remove

## 2022-07-02 NOTE — Telephone Encounter (Signed)
Patient was seen today and wanted you to know that Tizepatide 2.'5mg'$ /0.5 mi pen is not covered by her insurance please advise

## 2022-07-02 NOTE — Telephone Encounter (Signed)
Duplicate- see other message 07/02/22

## 2022-07-06 ENCOUNTER — Encounter: Payer: Self-pay | Admitting: *Deleted

## 2022-07-20 ENCOUNTER — Telehealth: Payer: Self-pay | Admitting: Family Medicine

## 2022-07-20 DIAGNOSIS — E785 Hyperlipidemia, unspecified: Secondary | ICD-10-CM

## 2022-07-20 DIAGNOSIS — D751 Secondary polycythemia: Secondary | ICD-10-CM

## 2022-07-20 DIAGNOSIS — E559 Vitamin D deficiency, unspecified: Secondary | ICD-10-CM

## 2022-07-20 DIAGNOSIS — Z0000 Encounter for general adult medical examination without abnormal findings: Secondary | ICD-10-CM

## 2022-07-20 NOTE — Telephone Encounter (Signed)
Patient is requesting labs before her insurance runs out starting a new job April 1st and she states hasn't had labs since 2020 from [facility] doctor.

## 2022-07-21 NOTE — Telephone Encounter (Signed)
Sort of hard to know what to order in this type of situation But please let patient know that we will try to be reasonable Recommend lipid, CMP, CBC, vitamin D Vitamin D deficiency, polycythemia, wellness, hyperlipidemia

## 2022-07-23 NOTE — Telephone Encounter (Signed)
Spoke with patient and labs are in Epic per Dr Nicki Reaper   Sort of hard to know what to order in this type of situation But please let patient know that we will try to be reasonable Recommend lipid, CMP, CBC, vitamin D Vitamin D deficiency, polycythemia, wellness, hyperlipidemia  Patient verbalized understanding Patient is not able to get Zepbound because of insurance. Her insurance will pay for Holley Raring, or [id number]. Patient would like to know if you could prescribe one of the three?

## 2022-07-24 ENCOUNTER — Encounter: Payer: Self-pay | Admitting: Nurse Practitioner

## 2022-07-24 NOTE — Telephone Encounter (Signed)
Note sent through MyChart

## 2022-07-27 LAB — CBC WITH DIFFERENTIAL/PLATELET
Basophils Absolute: 0 10*3/uL (ref 0.0–0.2)
Basos: 0 %
EOS (ABSOLUTE): 0.2 10*3/uL (ref 0.0–0.4)
Eos: 4 %
Hematocrit: 46.6 % (ref 34.0–46.6)
Hemoglobin: 15.8 g/dL (ref 11.1–15.9)
Immature Grans (Abs): 0 10*3/uL (ref 0.0–0.1)
Immature Granulocytes: 0 %
Lymphocytes Absolute: 1.9 10*3/uL (ref 0.7–3.1)
Lymphs: 36 %
MCH: 30.4 pg (ref 26.6–33.0)
MCHC: 33.9 g/dL (ref 31.5–35.7)
MCV: 90 fL (ref 79–97)
Monocytes Absolute: 0.3 10*3/uL (ref 0.1–0.9)
Monocytes: 5 %
Neutrophils Absolute: 2.8 10*3/uL (ref 1.4–7.0)
Neutrophils: 55 %
Platelets: 251 10*3/uL (ref 150–450)
RBC: 5.2 x10E6/uL (ref 3.77–5.28)
RDW: 12.2 % (ref 11.7–15.4)
WBC: 5.2 10*3/uL (ref 3.4–10.8)

## 2022-07-27 LAB — COMPREHENSIVE METABOLIC PANEL
ALT: 16 IU/L (ref 0–32)
AST: 11 IU/L (ref 0–40)
Albumin/Globulin Ratio: 2.4 — ABNORMAL HIGH (ref 1.2–2.2)
Albumin: 4.5 g/dL (ref 3.9–4.9)
Alkaline Phosphatase: 79 IU/L (ref 44–121)
BUN/Creatinine Ratio: 20 (ref 9–23)
BUN: 13 mg/dL (ref 6–24)
Bilirubin Total: 0.3 mg/dL (ref 0.0–1.2)
CO2: 22 mmol/L (ref 20–29)
Calcium: 9.5 mg/dL (ref 8.7–10.2)
Chloride: 105 mmol/L (ref 96–106)
Creatinine, Ser: 0.64 mg/dL (ref 0.57–1.00)
Globulin, Total: 1.9 g/dL (ref 1.5–4.5)
Glucose: 93 mg/dL (ref 70–99)
Potassium: 4.5 mmol/L (ref 3.5–5.2)
Sodium: 142 mmol/L (ref 134–144)
Total Protein: 6.4 g/dL (ref 6.0–8.5)
eGFR: 108 mL/min/{1.73_m2} (ref >59)

## 2022-07-27 LAB — LIPID PANEL
Chol/HDL Ratio: 3.3 ratio (ref 0.0–4.4)
Cholesterol, Total: 239 mg/dL — ABNORMAL HIGH (ref 100–199)
HDL: 72 mg/dL (ref >39)
LDL Chol Calc (NIH): 155 mg/dL — ABNORMAL HIGH (ref 0–99)
Triglycerides: 72 mg/dL (ref 0–149)
VLDL Cholesterol Cal: 12 mg/dL (ref 5–40)

## 2022-07-27 LAB — VITAMIN D 25 HYDROXY (VIT D DEFICIENCY, FRACTURES): Vit D, 25-Hydroxy: 25.4 ng/mL — ABNORMAL LOW (ref 30.0–100.0)

## 2022-08-06 ENCOUNTER — Telehealth: Payer: Commercial Managed Care - PPO | Admitting: Nurse Practitioner

## 2023-01-06 ENCOUNTER — Encounter: Payer: Self-pay | Admitting: *Deleted

## 2023-03-10 ENCOUNTER — Telehealth: Payer: Self-pay | Admitting: Internal Medicine

## 2023-03-10 NOTE — Telephone Encounter (Signed)
Patient called to follow up on a referral on 03/04/23 we received for a colonoscopy notations in referral stated she decided to go to [facility] where they had another referral on file for them because she did not want to wait till December.   The patient called today to proceed with the referral and stated she had no GI history and wanted to schedule for the colonoscopy and EGD however she had mentioned having some GI symptoms which were rectal bleeding and bloating. We Shanda Bumps and I) explained that she would need to have an initial office visit to discuss symptoms and procedure with one of the specialist providers here. She refused to do so and gave [place] a hard time about scheduling persisted in getting scheduled for the procedures without having an office visit because she lives an [place] away and then changed to saying she now had no symptoms. We advised the patient of the protocol of the office and once again explained the importance of the office visit. She then agreed to schedule but said that is she drives to the office from being that far away and nothing gets done that will not be paying for the visit. "Just warning you right now" I then told her that would be discuss with the doctor and it was out of my control. She refused to provide\confirm her address she said she will come personally to the office to pick up.

## 2023-06-16 ENCOUNTER — Ambulatory Visit: Payer: Commercial Managed Care - PPO | Admitting: Internal Medicine

## 2023-08-16 ENCOUNTER — Other Ambulatory Visit: Payer: Self-pay | Admitting: Gastroenterology

## 2023-08-16 DIAGNOSIS — R1310 Dysphagia, unspecified: Secondary | ICD-10-CM

## 2023-11-17 ENCOUNTER — Ambulatory Visit
Admission: RE | Admit: 2023-11-17 | Discharge: 2023-11-17 | Disposition: A | Discharge status: Home / Self Care | Source: Ambulatory Visit | Attending: Gastroenterology | Admitting: Gastroenterology

## 2023-11-17 DIAGNOSIS — R1310 Dysphagia, unspecified: Secondary | ICD-10-CM

## 2024-05-14 ENCOUNTER — Other Ambulatory Visit: Payer: Self-pay | Admitting: Obstetrics and Gynecology

## 2024-05-14 DIAGNOSIS — R928 Other abnormal and inconclusive findings on diagnostic imaging of breast: Secondary | ICD-10-CM

## 2024-05-19 ENCOUNTER — Ambulatory Visit
Admission: RE | Admit: 2024-05-19 | Discharge: 2024-05-19 | Disposition: A | Discharge status: Home / Self Care | Source: Ambulatory Visit | Attending: Obstetrics and Gynecology | Admitting: Obstetrics and Gynecology

## 2024-05-19 DIAGNOSIS — R928 Other abnormal and inconclusive findings on diagnostic imaging of breast: Secondary | ICD-10-CM

## 2024-05-21 ENCOUNTER — Other Ambulatory Visit: Payer: Self-pay | Admitting: Obstetrics and Gynecology

## 2024-05-21 DIAGNOSIS — R921 Mammographic calcification found on diagnostic imaging of breast: Secondary | ICD-10-CM

## 2024-05-30 ENCOUNTER — Other Ambulatory Visit: Payer: Self-pay | Admitting: Obstetrics and Gynecology

## 2024-05-30 ENCOUNTER — Ambulatory Visit
Admission: RE | Admit: 2024-05-30 | Discharge: 2024-05-30 | Disposition: A | Source: Ambulatory Visit | Attending: Obstetrics and Gynecology | Admitting: Obstetrics and Gynecology

## 2024-05-30 DIAGNOSIS — R921 Mammographic calcification found on diagnostic imaging of breast: Secondary | ICD-10-CM

## 2024-05-31 LAB — SURGICAL PATHOLOGY
